# Patient Record
Sex: Female | Born: 1970 | Race: White | Hispanic: No | Marital: Married | Smoking: Never smoker
Health system: Southern US, Community
[De-identification: ages and names within clinical notes are randomized; demographics above are authoritative.]

## PROBLEM LIST (undated history)

## (undated) DIAGNOSIS — D649 Anemia, unspecified: Secondary | ICD-10-CM

## (undated) DIAGNOSIS — R51 Headache: Secondary | ICD-10-CM

## (undated) DIAGNOSIS — I639 Cerebral infarction, unspecified: Secondary | ICD-10-CM

## (undated) DIAGNOSIS — D759 Disease of blood and blood-forming organs, unspecified: Secondary | ICD-10-CM

## (undated) DIAGNOSIS — C801 Malignant (primary) neoplasm, unspecified: Secondary | ICD-10-CM

## (undated) DIAGNOSIS — R519 Headache, unspecified: Secondary | ICD-10-CM

## (undated) HISTORY — PX: PARTIAL HYSTERECTOMY: SHX80

## (undated) HISTORY — PX: BLADDER SUSPENSION: SHX72

## (undated) HISTORY — PX: ABDOMINAL HYSTERECTOMY: SHX81

---

## 1991-05-30 DIAGNOSIS — I639 Cerebral infarction, unspecified: Secondary | ICD-10-CM

## 1991-05-30 HISTORY — DX: Cerebral infarction, unspecified: I63.9

## 1998-03-27 ENCOUNTER — Encounter: Payer: Self-pay | Admitting: Emergency Medicine

## 1998-03-27 ENCOUNTER — Emergency Department (HOSPITAL_COMMUNITY): Admission: EM | Admit: 1998-03-27 | Discharge: 1998-03-27 | Payer: Self-pay | Admitting: Emergency Medicine

## 1999-10-21 ENCOUNTER — Other Ambulatory Visit: Admission: RE | Admit: 1999-10-21 | Discharge: 1999-10-21 | Payer: Self-pay | Admitting: Gynecology

## 2000-05-02 ENCOUNTER — Ambulatory Visit (HOSPITAL_COMMUNITY): Admission: RE | Admit: 2000-05-02 | Discharge: 2000-05-02 | Payer: Self-pay | Admitting: *Deleted

## 2000-05-02 ENCOUNTER — Encounter: Payer: Self-pay | Admitting: *Deleted

## 2000-07-23 ENCOUNTER — Inpatient Hospital Stay (HOSPITAL_COMMUNITY): Admission: AD | Admit: 2000-07-23 | Discharge: 2000-07-25 | Payer: Self-pay | Admitting: *Deleted

## 2001-06-20 ENCOUNTER — Other Ambulatory Visit: Admission: RE | Admit: 2001-06-20 | Discharge: 2001-06-20 | Payer: Self-pay | Admitting: Gynecology

## 2003-10-09 ENCOUNTER — Other Ambulatory Visit: Admission: RE | Admit: 2003-10-09 | Discharge: 2003-10-09 | Payer: Self-pay | Admitting: Family Medicine

## 2004-02-12 ENCOUNTER — Ambulatory Visit (HOSPITAL_COMMUNITY): Admission: RE | Admit: 2004-02-12 | Discharge: 2004-02-12 | Payer: Self-pay | Admitting: Family Medicine

## 2004-04-19 ENCOUNTER — Other Ambulatory Visit: Admission: RE | Admit: 2004-04-19 | Discharge: 2004-04-19 | Payer: Self-pay | Admitting: Gynecology

## 2005-12-20 ENCOUNTER — Ambulatory Visit (HOSPITAL_COMMUNITY): Admission: RE | Admit: 2005-12-20 | Discharge: 2005-12-21 | Payer: Self-pay | Admitting: Obstetrics and Gynecology

## 2005-12-20 ENCOUNTER — Encounter (INDEPENDENT_AMBULATORY_CARE_PROVIDER_SITE_OTHER): Payer: Self-pay | Admitting: *Deleted

## 2008-09-29 ENCOUNTER — Ambulatory Visit (HOSPITAL_COMMUNITY): Admission: RE | Admit: 2008-09-29 | Discharge: 2008-09-29 | Payer: Self-pay | Admitting: Obstetrics and Gynecology

## 2008-11-24 ENCOUNTER — Encounter: Admission: RE | Admit: 2008-11-24 | Discharge: 2008-11-24 | Payer: Self-pay | Admitting: Obstetrics and Gynecology

## 2010-06-20 ENCOUNTER — Encounter: Payer: Self-pay | Admitting: Obstetrics and Gynecology

## 2010-09-06 LAB — CBC
HCT: 44.8 % (ref 36.0–46.0)
Hemoglobin: 15.5 g/dL — ABNORMAL HIGH (ref 12.0–15.0)
MCHC: 34.6 g/dL (ref 30.0–36.0)
MCV: 92.3 fL (ref 78.0–100.0)
RBC: 4.85 MIL/uL (ref 3.87–5.11)
RDW: 12.3 % (ref 11.5–15.5)

## 2010-10-11 NOTE — Op Note (Signed)
[name], [name]               ACCOUNT NO.:  [id number]   MEDICAL RECORD NO.:  [id number]          PATIENT TYPE:  AMB   LOCATION:  SDC                           FACILITY:  [facility]   PHYSICIAN:  Duke Salvia. Marcelle Overlie, M.D.DATE OF BIRTH:  11-03-1970   DATE OF PROCEDURE:  09/29/2008  DATE OF DISCHARGE:                               OPERATIVE REPORT   PREOPERATIVE DIAGNOSIS:  Stress urinary incontinence.   POSTOPERATIVE DIAGNOSIS:  Stress urinary incontinence.   PROCEDURE:  Mid urethral sling, Solyx single incision sling.   SURGEON:  Duke Salvia. Marcelle Overlie, MD   ANESTHESIA:  General.   COMPLICATIONS:  None.   DRAINS:  Foley catheter.   SPECIMENS:  None.   BLOOD LOSS:  Less than 50 mL.   PROCEDURE AND FINDINGS:  The patient was taken to the operating room.  After an adequate level of general anesthetic was obtained with the  patient's legs in stirrups, the perineum and vagina were prepped and  draped.  The bladder was drained.  EUA carried out, which was  unremarkable.  Weighted speculum was positioned.  Xylocaine 1% with  dilute epinephrine was then used to inject the mid urethral area and  into the area of dissection behind the inferior pubic ramus.  A 2-2.5 cm  mid urethral vertical incision was then made, Metzenbaum scissors were  then used to perform minimal sharp dissection, and the surgeon's finger  was used perform blunt dissection to palpate the posterior portion of  the inferior pubic ramus on each side.  The Solyx SIS was then anchored  starting on the patient's right side, placing the inserter behind the  inferior pubic ramus at a 45-degree angle and anchoring it at the  midpoint per protocol.  The sling was then reloaded and positioned  behind the inferior pubic ramus on the left, anchoring in obturator  internus with appropriate tensioning, then released in the anchor.  The  incision was closed with interrupted 2-0 Vicryl sutures.  Cystoscopy was  carried out revealing  no evidence of any injury to bladder or urethra.  Foley catheter was positioned at that point.  A vaginal pack was placed  for pressure hemostasis.  She tolerated this well and went to recovery  room in good condition.      Richard M. Marcelle Overlie, M.D.  Electronically Signed    RMH/MEDQ  D:  09/29/2008  T:  09/29/2008  Job:  [id number]

## 2010-10-11 NOTE — H&P (Signed)
NAMESHARHONDA, ATWOOD               ACCOUNT NO.:  [id number]   MEDICAL RECORD NO.:  [id number]          PATIENT TYPE:  AMB   LOCATION:                                [facility]  [facility]   PHYSICIAN:  Duke Salvia. Marcelle Overlie, M.D.DATE OF BIRTH:  August 20, 1970   DATE OF ADMISSION:  09/29/2008  DATE OF DISCHARGE:                              HISTORY & PHYSICAL   Date of surgery is Sep 29, 2008.   CHIEF COMPLAINT:  Stress urinary incontinence   HISTORY OF PRESENT ILLNESS:  A 40 year old G2, P1, prior hysterectomy  with a 44 year old child, who has had SUI that has worsened over time,  recently had urodynamics performed that did show leakage with a full  bladder.  PVR at 9 mL with a normal bladder capacity, LPP 74, 98 and 105  with an MUCP that was normal 86 and 88.  She presents now for Solyx SIS  mid urethral sling.  This procedure including risks related to bleeding,  infection, mesh erosion, urinary retention expected continent rates all  discussed with her, which she understands and accepts.   PAST MEDICAL HISTORY.:   ALLERGIES:  PENICILLIN, ASPIRIN, LODINE, PERCOCET, and DEMEROL.   PCP is Dr. Laurann Montana.   PAST SURGICAL HISTORY:  She has had one delivery in 1997 and  hysterectomy in 2007.   FAMILY HISTORY:  Significant for migraine headache, heart disease,  asthma, hepatitis, epilepsy, thyroid disease, tuberculosis, gallbladder  disease, kidney disease, UTI, osteoporosis, arthritis, diabetes, and  breast, uterine and colon cancer.   SOCIAL HISTORY:  Denies drug or cigarette or alcohol use.  She is  married.   PHYSICAL EXAMINATION:  VITAL SIGNS:  Temperature 98.2 and blood pressure  120/78.  HEENT: Unremarkable.  NECK:  Supple without masses.  LUNGS:  Clear.  CARDIOVASCULAR:  Regular rate and rhythm without murmurs, rubs, or  gallops.  BREASTS:  Without masses.  ABDOMEN:  Soft, flat, and nontender.  PELVIC:  Normal external genitalia.  The vaginal cuff clear.  Bimanual  negative.  EXTREMITIES:  Unremarkable.  NEUROLOGIC:  Unremarkable.   IMPRESSION:  Stress urinary incontinence.   PLAN:  Solyx mid ureteral sling.  Procedure and risks reviewed as above.      Richard M. Marcelle Overlie, M.D.  Electronically Signed     RMH/MEDQ  D:  09/29/2008  T:  09/30/2008  Job:  [id number]
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
## 2010-10-14 NOTE — H&P (Signed)
[name], [name]               ACCOUNT NO.:  [id number]   MEDICAL RECORD NO.:  [id number]          PATIENT TYPE:  AMB   LOCATION:  SDC                           [facility]ACILITY:  [facility]   PHYSICIAN:  Duke Salvia. Marcelle Overlie, M.D.DATE OF BIRTH:  [birth year]   DATE OF ADMISSION:  12/20/2005  DATE OF DISCHARGE:                                HISTORY & PHYSICAL   CHIEF COMPLAINT:  Dysmenorrhea, menorrhagia.   HISTORY OF PRESENT ILLNESS:  A 40 year old, G2, P1 who had one child  delivered vaginally in 1997, her husband has had a vasectomy. She has been  seeing another GYN physician complaining of heavy menstrual flow with severe  cramping and collisional-type dyspareunia with no improvements with  conservative measures such as ovulation suppression. We discussed a number  of options including laparoscopy. She has a preference for definitive  hysterectomy and presents now for LAVH.  This procedure including the risk  of bleeding, infection, transfusion, adjacent organ injury along with her  expected recovery time are all reviewed. I did discuss the possibility of  USO if significant ovarian polyp pathology was noted.   PAST MEDICAL HISTORY:   ALLERGIES:  PENICILLIN, PERCOCET, CODEINE, DEMEROL, ASPIRIN.   PAST SURGICAL HISTORY:  Tonsillectomy, fractured arm.   REVIEW OF SYSTEMS:  She has had a colposcopy in the past but she has never  had dysplasia that was treated. Last Pap was dated June 2007 which was  normal.   FAMILY HISTORY:  Significant for mother with IBS, history of hypertension in  both parents along with diabetes.   OBSTETRICAL HISTORY:  One vaginal delivery in 1997, one SAB in 1995.   PHYSICAL EXAMINATION:  VITAL SIGNS:  Temperature 98.2, blood pressure  128/72.  HEENT:  Unremarkable.  NECK:  Supple without masses.  LUNGS:  Clear.  CARDIOVASCULAR:  Regular rate and rhythm without murmurs, rubs or gallops  noted.  BREASTS:  Without masses.  ABDOMEN:  Soft, flat, nontender.  PELVIC:  Normal external genitalia. vagina and cervix clear.  Uterus mid  position, normal size, adnexa negative. No unusual nodularity or messes.  EXTREMITIES/NEUROLOGIC:  Unremarkable.   IMPRESSION:  Menorrhagia with collisional dyspareunia and dysmenorrhea.   PLAN:  Laparoscopically assisted vaginal hysterectomy. Procedure and risks  reviewed as above.      Richard M. Marcelle Overlie, M.D.  Electronically Signed     RMH/MEDQ  D:  12/18/2005  T:  12/18/2005  Job:  [id number]
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
## 2010-10-14 NOTE — Op Note (Signed)
[name], [name]               ACCOUNT NO.:  [id number]   MEDICAL RECORD NO.:  [id number]          PATIENT TYPE:  AMB   LOCATION:  SDC                           FACILITY:  [facility]   PHYSICIAN:  Duke Salvia. Marcelle Overlie, M.D.DATE OF BIRTH:  [birth year]   DATE OF PROCEDURE:  12/20/2005  DATE OF DISCHARGE:                                 OPERATIVE REPORT   PREOPERATIVE DIAGNOSES:  1.  Menorrhagia.  2.  Pelvic pain.  3.  Dyspareunia.   POSTOPERATIVE DIAGNOSES:  1.  Menorrhagia.  2.  Pelvic pain.  3.  Dyspareunia.  4.  Left peritubal adhesion.   PROCEDURE:  Diagnostic laparoscopy with lysis of adhesions, laparoscopically-  assisted vaginal hysterectomy.   SURGEON:  Duke Salvia. Marcelle Overlie, M.D.   ASSISTANT:  Juluis Mire, M.D.   ANESTHESIA:  General endotracheal.   COMPLICATIONS:  None.   DRAINS:  Foley catheter.   BLOOD LOSS:  150.   SPECIMENS REMOVED:  Uterus.   PROCEDURE AND FINDINGS:  The patient was taken to the operating room.  After  an adequate level of general endotracheal anesthesia was obtained with the  patient's legs in stirrups, the abdomen, perineum and vagina were prepped  and draped in the usual manner for laparoscopy.  The bladder was drained  with a Foley catheter.  A Hulka tenaculum was positioned.  Attention  directed to the abdomen, where a 2 cm subumbilical incision was made after  infiltrating with 0.5% Marcaine plain.  The Veress needle was introduced  without difficulty, its intra-abdominal position was verified by pressure  and water testing.  After a 2.5 L pneumoperitoneum was then created, the  laparoscopic trocar and sleeve were then introduced without difficulty.  There was no evidence any bleeding or trauma.  Three fingerbreadths above  the symphysis in the midline a 5 mm trocar was inserted under direct  visualization.  The patient then placed in Trendelenburg and the pelvic  findings as follows.   The uterus itself was normal size, mobile.  The  anterior and posterior cul-  de-sac spaces were free and clear.  The right tube and ovary, upper abdomen  and appendix were normal.  The left ovary had a small functional cyst,  postovulatory.  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  With blunt dissection this was  freed up and was hemostatic.  It allowed the tube to be elevated.  She had  complained some of more pain on the left side.  The remainder of the tube  and ovary were completely normal.  A decision made to conserve.  No other  adhesions noted.  The gyrus PK instrument was then used to coagulate and  divide the utero-ovarian pedicle on each side down to and including the  round ligament with excellent hemostasis.  The vaginal portion procedure  started at this point.   Legs were extended, the weighted speculum was positioned and the cervix  grasped with a tenaculum.  The cervicovaginal mucosa was incised with the  Bovie.  Posterior colpotomy performed without difficulty.  The bladder was  advanced superiorly with sharp and blunt dissection until the peritoneal  reflection could be identified.  This was entered sharply and a retractor  used to gently elevate the bladder out of the field.  Using the handheld  Gyrus PK, the lower broad ligament pedicles, uterine vasculature pedicle and  upper broad ligament pedicles were clamped, coagulated and divided, staying  close to the uterus.  The fundus of the uterus was then delivered  posteriorly.  Remaining pedicles were coagulated and divided.  The specimen  was removed.  Cuff closed with a running locked 2-0 Vicryl suture.  The  McCall culdoplasty suture was then positioned, picking up left uterosacral  ligament, posterior peritoneum across to the right uterosacral ligament,  then tied down for extra posterior support.  Prior to closure the  sponge,  needle and instrument counts were reported as correct x2.  Cuff closed from  right to left with interrupted 2-0 Monocryl sutures with excellent  hemostasis.  Foley catheter positioned draining clear urine.  Repeat  laparoscopy carried out at that point, copious irrigation carried out with  aspiration, pressure was reduced, the operative site inspected carefully and  noted to be hemostatic.  Instruments were removed, gas allowed to escape.  The deep fascia was closed with 4-0Dexon, subcuticular sutures and  Dermabond.  She tolerated this well, went to the recovery room in good  condition.      Richard M. Marcelle Overlie, M.D.  Electronically Signed     RMH/MEDQ  D:  12/20/2005  T:  12/20/2005  Job:  [id number]

## 2014-12-10 ENCOUNTER — Other Ambulatory Visit: Payer: Self-pay | Admitting: Obstetrics and Gynecology

## 2014-12-14 LAB — CYTOLOGY - PAP

## 2015-01-20 NOTE — H&P (Signed)
[facility]  DICTATION #  CSN# [id number]   Margarette Asal, MD 01/20/2015 10:24 AM

## 2015-01-21 NOTE — H&P (Signed)
NAME:  [name], [name]                    ACCOUNT NO.:  MEDICAL RECORD NO.:  [id number]  LOCATION:                                 FACILITY:  PHYSICIAN:  Ralene Bathe. Matthew Saras, M.D.DATE OF BIRTH:  1971/05/06  DATE OF ADMISSION: DATE OF DISCHARGE:                             HISTORY & PHYSICAL   CHIEF COMPLAINT:  Pelvic pain, dyspareunia, symptomatic cystocele and rectocele.  HISTORY OF PRESENT ILLNESS:  A 44 year old, G2, P1.  The patient had an LAVH in 2007.  The ovaries were normal at that time, and had a mid urethral sling in 2010 and has been continent since that time, but recently has experienced worsening problem with pelvic pain in particular, deep dyspareunia along with symptomatic cystocele and rectocele.  Temple 10/15 was 15.9.  Ultrasound 10/15 did not show any adnexal masses.  On examination, she did have a mild-to-moderate cystocele and rectocele with cuff support and the UV angle in the mid urethral area from her prior sling all looked normal.  At the time of the consult in November, 2015, it was told that other have been used to evaluate her problem would be laparoscopy or further imaging.  Because of the continued problems with dyspareunia and pain, she presents at this time for laparoscopy with possible USO or BSO along with A and P repair.  The possibility requiring laparotomy with BSO are discussed with her.  Other specific risks related to bleeding, infection, adjacent organ injury, wound infection, phlebitis along with her expected recovery time, with the need for ERT discuss with her, which she understands and accepts.  ALLERGIES:  Aspirin, penicillin, Percocet, codeine, and Demerol  OBSTETRICAL HISTORY:  She has had 1 vaginal delivery in 1997, LAVH in 2007, and mid urethral sling in 2010.  FAMILY HISTORY:  Significant for headache, heart disease, hepatitis, IBS, epilepsy, thyroid disease, tuberculosis, gallbladder disease, kidney disease, UTI, osteoporosis,  arthritis, diabetes, and uterine cancer.  SOCIAL HISTORY:  Denies alcohol, tobacco or drug use.  She is married. Dr. Burt Ek is her medical doctor.  PHYSICAL EXAMINATION:  VITAL SIGNS:  Temp 98.2 and blood pressure 120/78.  HEENT:  Unremarkable.  NECK:  Supple without masses.  LUNGS: Clear.  CARDIOVASCULAR:  Regular rate and rhythm without murmurs, rubs or gallops.  BREASTS:  Without masses.  ABDOMEN:  Soft, flat, and nontender.  GENITOURINARY:  Vulva unremarkable.  There is a mild cystocele and rectocele with straining.  Cup support looks.  Bimanual reveals no definite nodularity or masses.  She does have some discomfort.  IMPRESSION:  Dyspareunia, pelvic pain, possible adnexal adhesions discussed with her, cystocele and rectocele.  PLAN:  Laparoscopy with possible USO or BSO, A and P repair.  Procedure and risks discussed as above.     Amandy Chubbuck M. Matthew Saras, M.D.     [facility]  D:  01/20/2015  T:  01/20/2015  Job:  [id number]

## 2015-01-25 ENCOUNTER — Encounter (HOSPITAL_COMMUNITY): Payer: Self-pay

## 2015-01-25 ENCOUNTER — Encounter (HOSPITAL_COMMUNITY)
Admission: RE | Admit: 2015-01-25 | Discharge: 2015-01-25 | Disposition: A | Discharge status: Home / Self Care | Payer: BLUE CROSS/BLUE SHIELD | Source: Ambulatory Visit | Attending: Obstetrics and Gynecology | Admitting: Obstetrics and Gynecology

## 2015-01-25 ENCOUNTER — Encounter (INDEPENDENT_AMBULATORY_CARE_PROVIDER_SITE_OTHER): Payer: Self-pay

## 2015-01-25 DIAGNOSIS — N736 Female pelvic peritoneal adhesions (postinfective): Secondary | ICD-10-CM | POA: Diagnosis not present

## 2015-01-25 DIAGNOSIS — N8110 Cystocele, unspecified: Secondary | ICD-10-CM | POA: Diagnosis not present

## 2015-01-25 DIAGNOSIS — N941 Dyspareunia: Secondary | ICD-10-CM | POA: Diagnosis not present

## 2015-01-25 DIAGNOSIS — Z880 Allergy status to penicillin: Secondary | ICD-10-CM | POA: Diagnosis not present

## 2015-01-25 DIAGNOSIS — Z8673 Personal history of transient ischemic attack (TIA), and cerebral infarction without residual deficits: Secondary | ICD-10-CM | POA: Diagnosis not present

## 2015-01-25 DIAGNOSIS — Z886 Allergy status to analgesic agent status: Secondary | ICD-10-CM | POA: Diagnosis not present

## 2015-01-25 DIAGNOSIS — Z6831 Body mass index (BMI) 31.0-31.9, adult: Secondary | ICD-10-CM | POA: Diagnosis not present

## 2015-01-25 DIAGNOSIS — N816 Rectocele: Secondary | ICD-10-CM | POA: Diagnosis present

## 2015-01-25 DIAGNOSIS — E669 Obesity, unspecified: Secondary | ICD-10-CM | POA: Diagnosis not present

## 2015-01-25 DIAGNOSIS — Z885 Allergy status to narcotic agent status: Secondary | ICD-10-CM | POA: Diagnosis not present

## 2015-01-25 DIAGNOSIS — D649 Anemia, unspecified: Secondary | ICD-10-CM | POA: Diagnosis not present

## 2015-01-25 HISTORY — DX: Anemia, unspecified: D64.9

## 2015-01-25 HISTORY — DX: Malignant (primary) neoplasm, unspecified: C80.1

## 2015-01-25 HISTORY — DX: Disease of blood and blood-forming organs, unspecified: D75.9

## 2015-01-25 HISTORY — DX: Headache: R51

## 2015-01-25 HISTORY — DX: Headache, unspecified: R51.9

## 2015-01-25 HISTORY — DX: Cerebral infarction, unspecified: I63.9

## 2015-01-25 LAB — CBC
HEMATOCRIT: 44.1 % (ref 36.0–46.0)
HEMOGLOBIN: 15.4 g/dL — AB (ref 12.0–15.0)
MCH: 31 pg (ref 26.0–34.0)
MCHC: 34.9 g/dL (ref 30.0–36.0)
MCV: 88.9 fL (ref 78.0–100.0)
Platelets: 278 10*3/uL (ref 150–400)
RBC: 4.96 MIL/uL (ref 3.87–5.11)
RDW: 12.6 % (ref 11.5–15.5)
WBC: 10.7 10*3/uL — ABNORMAL HIGH (ref 4.0–10.5)

## 2015-01-25 NOTE — Patient Instructions (Signed)
Your procedure is scheduled on:01/28/15  Enter through the Main Entrance at :[place] up desk phone and dial [phone] and inform us of your arrival.  Please call [phone] if you have any problems the morning of surgery.  Remember: Do not eat food or drink liquids, including water, after midnight:WED.    You may brush your teeth the morning of surgery.  Take these meds the morning of surgery with a sip of water:none  DO NOT wear jewelry, eye make-up, lipstick,body lotion, or dark fingernail polish.  (Polished toes are ok) You may wear deodorant.  If you are to be admitted after surgery, leave suitcase in car until your room has been assigned. Patients discharged on the day of surgery will not be allowed to drive home. Wear loose fitting, comfortable clothes for your ride home.

## 2015-01-27 MED ORDER — GENTAMICIN SULFATE 40 MG/ML IJ SOLN
INTRAMUSCULAR | Status: AC
  Administered 2015-01-28: 114.5 mL via INTRAVENOUS
  Filled 2015-01-27: qty 8.5

## 2015-01-28 ENCOUNTER — Encounter (HOSPITAL_COMMUNITY): Payer: Self-pay | Admitting: *Deleted

## 2015-01-28 ENCOUNTER — Ambulatory Visit (HOSPITAL_COMMUNITY): Payer: BLUE CROSS/BLUE SHIELD | Admitting: Anesthesiology

## 2015-01-28 ENCOUNTER — Observation Stay (HOSPITAL_COMMUNITY)
Admission: RE | Admit: 2015-01-28 | Discharge: 2015-01-29 | Disposition: A | Discharge status: Home / Self Care | Payer: BLUE CROSS/BLUE SHIELD | Source: Ambulatory Visit | Attending: Obstetrics and Gynecology | Admitting: Obstetrics and Gynecology

## 2015-01-28 ENCOUNTER — Encounter (HOSPITAL_COMMUNITY)
Admission: RE | Disposition: A | Discharge status: Home / Self Care | Payer: Self-pay | Source: Ambulatory Visit | Attending: Obstetrics and Gynecology

## 2015-01-28 DIAGNOSIS — N941 Dyspareunia: Secondary | ICD-10-CM | POA: Diagnosis not present

## 2015-01-28 DIAGNOSIS — Z880 Allergy status to penicillin: Secondary | ICD-10-CM | POA: Insufficient documentation

## 2015-01-28 DIAGNOSIS — R102 Pelvic and perineal pain: Secondary | ICD-10-CM | POA: Diagnosis present

## 2015-01-28 DIAGNOSIS — N8110 Cystocele, unspecified: Secondary | ICD-10-CM | POA: Insufficient documentation

## 2015-01-28 DIAGNOSIS — Z885 Allergy status to narcotic agent status: Secondary | ICD-10-CM | POA: Insufficient documentation

## 2015-01-28 DIAGNOSIS — N816 Rectocele: Secondary | ICD-10-CM | POA: Insufficient documentation

## 2015-01-28 DIAGNOSIS — Z6831 Body mass index (BMI) 31.0-31.9, adult: Secondary | ICD-10-CM | POA: Insufficient documentation

## 2015-01-28 DIAGNOSIS — D649 Anemia, unspecified: Secondary | ICD-10-CM | POA: Insufficient documentation

## 2015-01-28 DIAGNOSIS — E669 Obesity, unspecified: Secondary | ICD-10-CM | POA: Insufficient documentation

## 2015-01-28 DIAGNOSIS — Z886 Allergy status to analgesic agent status: Secondary | ICD-10-CM | POA: Insufficient documentation

## 2015-01-28 DIAGNOSIS — N736 Female pelvic peritoneal adhesions (postinfective): Secondary | ICD-10-CM | POA: Insufficient documentation

## 2015-01-28 DIAGNOSIS — Z8673 Personal history of transient ischemic attack (TIA), and cerebral infarction without residual deficits: Secondary | ICD-10-CM | POA: Insufficient documentation

## 2015-01-28 HISTORY — PX: ANTERIOR AND POSTERIOR REPAIR: SHX5121

## 2015-01-28 HISTORY — PX: LAPAROSCOPIC SALPINGO OOPHERECTOMY: SHX5927

## 2015-01-28 LAB — TYPE AND SCREEN
ABO/RH(D): O POS
Antibody Screen: NEGATIVE

## 2015-01-28 SURGERY — ANTERIOR (CYSTOCELE) AND POSTERIOR REPAIR (RECTOCELE)
Anesthesia: General

## 2015-01-28 MED ORDER — METOCLOPRAMIDE HCL 5 MG/ML IJ SOLN
INTRAMUSCULAR | Status: AC
  Filled 2015-01-28: qty 2

## 2015-01-28 MED ORDER — LIDOCAINE HCL (CARDIAC) 20 MG/ML IV SOLN
INTRAVENOUS | Status: DC | PRN
  Administered 2015-01-28: 40 mg via INTRAVENOUS
  Administered 2015-01-28: 60 mg via INTRAVENOUS

## 2015-01-28 MED ORDER — ONDANSETRON HCL 4 MG/2ML IJ SOLN
4.0000 mg | Freq: Four times a day (QID) | INTRAMUSCULAR | Status: DC | PRN
  Filled 2015-01-28: qty 2

## 2015-01-28 MED ORDER — ROCURONIUM BROMIDE 100 MG/10ML IV SOLN
INTRAVENOUS | Status: DC | PRN
  Administered 2015-01-28: 25 mg via INTRAVENOUS
  Administered 2015-01-28: 5 mg via INTRAVENOUS

## 2015-01-28 MED ORDER — LIDOCAINE HCL (CARDIAC) 20 MG/ML IV SOLN
INTRAVENOUS | Status: AC
  Filled 2015-01-28: qty 5

## 2015-01-28 MED ORDER — ACETAMINOPHEN 10 MG/ML IV SOLN
1000.0000 mg | Freq: Once | INTRAVENOUS | Status: AC
  Administered 2015-01-28: 1000 mg via INTRAVENOUS
  Filled 2015-01-28: qty 100

## 2015-01-28 MED ORDER — METOCLOPRAMIDE HCL 5 MG/ML IJ SOLN
10.0000 mg | Freq: Once | INTRAMUSCULAR | Status: AC | PRN
  Administered 2015-01-28: 10 mg via INTRAVENOUS

## 2015-01-28 MED ORDER — BUPIVACAINE HCL (PF) 0.25 % IJ SOLN
INTRAMUSCULAR | Status: AC
  Filled 2015-01-28: qty 30

## 2015-01-28 MED ORDER — NEOSTIGMINE METHYLSULFATE 10 MG/10ML IV SOLN
INTRAVENOUS | Status: AC
  Filled 2015-01-28: qty 1

## 2015-01-28 MED ORDER — ACETAMINOPHEN 10 MG/ML IV SOLN
1000.0000 mg | Freq: Once | INTRAVENOUS | Status: DC
  Filled 2015-01-28: qty 100

## 2015-01-28 MED ORDER — MENTHOL 3 MG MT LOZG: 1.0000 | LOZENGE | OROMUCOSAL | Status: DC | PRN

## 2015-01-28 MED ORDER — GLYCOPYRROLATE 0.2 MG/ML IJ SOLN
INTRAMUSCULAR | Status: DC | PRN
  Administered 2015-01-28: 0.1 mg via INTRAVENOUS
  Administered 2015-01-28: .3 mg via INTRAVENOUS
  Administered 2015-01-28: 0.1 mg via INTRAVENOUS

## 2015-01-28 MED ORDER — DEXTROSE IN LACTATED RINGERS 5 % IV SOLN
INTRAVENOUS | Status: DC
  Administered 2015-01-28 (×2): via INTRAVENOUS

## 2015-01-28 MED ORDER — ESTRADIOL 0.1 MG/GM VA CREA
TOPICAL_CREAM | VAGINAL | Status: DC | PRN
  Administered 2015-01-28: 1 via VAGINAL

## 2015-01-28 MED ORDER — LIDOCAINE-EPINEPHRINE 1 %-1:100000 IJ SOLN
INTRAMUSCULAR | Status: DC | PRN
  Administered 2015-01-28 (×2): 5 mL

## 2015-01-28 MED ORDER — ESTRADIOL 0.1 MG/GM VA CREA
TOPICAL_CREAM | VAGINAL | Status: AC
  Filled 2015-01-28: qty 42.5

## 2015-01-28 MED ORDER — SODIUM CHLORIDE 0.9 % IJ SOLN: 9.0000 mL | INTRAMUSCULAR | Status: DC | PRN

## 2015-01-28 MED ORDER — DEXAMETHASONE SODIUM PHOSPHATE 10 MG/ML IJ SOLN
INTRAMUSCULAR | Status: AC
  Filled 2015-01-28: qty 1

## 2015-01-28 MED ORDER — MIDAZOLAM HCL 2 MG/2ML IJ SOLN
INTRAMUSCULAR | Status: AC
  Filled 2015-01-28: qty 4

## 2015-01-28 MED ORDER — MORPHINE SULFATE 1 MG/ML IV SOLN: INTRAVENOUS | Status: DC

## 2015-01-28 MED ORDER — FENTANYL CITRATE (PF) 250 MCG/5ML IJ SOLN
INTRAMUSCULAR | Status: AC
Start: 2015-01-28 — End: 2015-01-28
  Filled 2015-01-28: qty 25

## 2015-01-28 MED ORDER — GLYCOPYRROLATE 0.2 MG/ML IJ SOLN
INTRAMUSCULAR | Status: AC
  Filled 2015-01-28: qty 3

## 2015-01-28 MED ORDER — LIDOCAINE-EPINEPHRINE 1 %-1:100000 IJ SOLN
INTRAMUSCULAR | Status: AC
  Filled 2015-01-28: qty 1

## 2015-01-28 MED ORDER — MIDAZOLAM HCL 2 MG/2ML IJ SOLN
INTRAMUSCULAR | Status: DC | PRN
  Administered 2015-01-28: 2 mg via INTRAVENOUS

## 2015-01-28 MED ORDER — SCOPOLAMINE 1 MG/3DAYS TD PT72
1.0000 | MEDICATED_PATCH | Freq: Once | TRANSDERMAL | Status: DC
  Administered 2015-01-28: 1.5 mg via TRANSDERMAL

## 2015-01-28 MED ORDER — SODIUM CHLORIDE 0.9 % IJ SOLN
INTRAMUSCULAR | Status: DC | PRN
  Administered 2015-01-28: 10 mL

## 2015-01-28 MED ORDER — FENTANYL CITRATE (PF) 250 MCG/5ML IJ SOLN
INTRAMUSCULAR | Status: DC | PRN
  Administered 2015-01-28 (×2): 50 ug via INTRAVENOUS
  Administered 2015-01-28: 100 ug via INTRAVENOUS

## 2015-01-28 MED ORDER — OXYCODONE-ACETAMINOPHEN 5-325 MG PO TABS: 1.0000 | ORAL_TABLET | ORAL | Status: DC | PRN

## 2015-01-28 MED ORDER — ONDANSETRON HCL 4 MG/2ML IJ SOLN
INTRAMUSCULAR | Status: DC | PRN
Start: 2015-01-28 — End: 2015-01-28
  Administered 2015-01-28: 4 mg via INTRAVENOUS

## 2015-01-28 MED ORDER — FENTANYL CITRATE (PF) 100 MCG/2ML IJ SOLN
25.0000 ug | INTRAMUSCULAR | Status: DC | PRN
  Administered 2015-01-28 (×2): 50 ug via INTRAVENOUS

## 2015-01-28 MED ORDER — BUTORPHANOL TARTRATE 1 MG/ML IJ SOLN: 1.0000 mg | INTRAMUSCULAR | Status: DC | PRN

## 2015-01-28 MED ORDER — PROPOFOL 10 MG/ML IV BOLUS
INTRAVENOUS | Status: DC | PRN
  Administered 2015-01-28: 40 mg via INTRAVENOUS
  Administered 2015-01-28: 170 mg via INTRAVENOUS

## 2015-01-28 MED ORDER — SCOPOLAMINE 1 MG/3DAYS TD PT72
MEDICATED_PATCH | TRANSDERMAL | Status: DC
Start: 2015-01-28 — End: 2015-01-29
  Administered 2015-01-28: 1.5 mg via TRANSDERMAL
  Filled 2015-01-28: qty 1

## 2015-01-28 MED ORDER — DIPHENHYDRAMINE HCL 12.5 MG/5ML PO ELIX: 12.5000 mg | ORAL_SOLUTION | Freq: Four times a day (QID) | ORAL | Status: DC | PRN

## 2015-01-28 MED ORDER — CEFAZOLIN SODIUM-DEXTROSE 2-3 GM-% IV SOLR
INTRAVENOUS | Status: AC
  Filled 2015-01-28: qty 50

## 2015-01-28 MED ORDER — DIPHENHYDRAMINE HCL 50 MG/ML IJ SOLN: 12.5000 mg | Freq: Four times a day (QID) | INTRAMUSCULAR | Status: DC | PRN

## 2015-01-28 MED ORDER — 0.9 % SODIUM CHLORIDE (POUR BTL) OPTIME
TOPICAL | Status: DC | PRN
  Administered 2015-01-28: 1000 mL

## 2015-01-28 MED ORDER — PROPOFOL 10 MG/ML IV BOLUS
INTRAVENOUS | Status: AC
  Filled 2015-01-28: qty 20

## 2015-01-28 MED ORDER — NEOSTIGMINE METHYLSULFATE 10 MG/10ML IV SOLN
INTRAVENOUS | Status: DC | PRN
  Administered 2015-01-28: 1.5 mg via INTRAVENOUS

## 2015-01-28 MED ORDER — BUPIVACAINE HCL (PF) 0.25 % IJ SOLN
INTRAMUSCULAR | Status: DC | PRN
  Administered 2015-01-28: 5 mL

## 2015-01-28 MED ORDER — FENTANYL CITRATE (PF) 100 MCG/2ML IJ SOLN
INTRAMUSCULAR | Status: AC
  Filled 2015-01-28: qty 2

## 2015-01-28 MED ORDER — LACTATED RINGERS IV SOLN
INTRAVENOUS | Status: DC
  Administered 2015-01-28 (×3): via INTRAVENOUS

## 2015-01-28 MED ORDER — ONDANSETRON HCL 4 MG/2ML IJ SOLN
4.0000 mg | Freq: Four times a day (QID) | INTRAMUSCULAR | Status: DC | PRN
  Administered 2015-01-28: 4 mg via INTRAVENOUS

## 2015-01-28 MED ORDER — SODIUM CHLORIDE 0.9 % IJ SOLN
INTRAMUSCULAR | Status: AC
  Filled 2015-01-28: qty 10

## 2015-01-28 MED ORDER — DEXAMETHASONE SODIUM PHOSPHATE 4 MG/ML IJ SOLN
INTRAMUSCULAR | Status: DC | PRN
  Administered 2015-01-28: 4 mg via INTRAVENOUS

## 2015-01-28 MED ORDER — ONDANSETRON HCL 4 MG PO TABS: 4.0000 mg | ORAL_TABLET | Freq: Four times a day (QID) | ORAL | Status: DC | PRN

## 2015-01-28 MED ORDER — FENTANYL 10 MCG/ML IV SOLN
INTRAVENOUS | Status: DC
  Administered 2015-01-28: 11:00:00 via INTRAVENOUS
  Administered 2015-01-28: 60 ug via INTRAVENOUS
  Administered 2015-01-28: 16 mL via INTRAVENOUS
  Administered 2015-01-28: 22 mL via INTRAVENOUS
  Administered 2015-01-28: 23:00:00 via INTRAVENOUS
  Administered 2015-01-29: 139.4 ug via INTRAVENOUS
  Filled 2015-01-28 (×2): qty 50

## 2015-01-28 MED ORDER — NALOXONE HCL 0.4 MG/ML IJ SOLN: 0.4000 mg | INTRAMUSCULAR | Status: DC | PRN

## 2015-01-28 MED ORDER — ONDANSETRON HCL 4 MG/2ML IJ SOLN
INTRAMUSCULAR | Status: AC
  Filled 2015-01-28: qty 2

## 2015-01-28 MED ORDER — ROCURONIUM BROMIDE 100 MG/10ML IV SOLN
INTRAVENOUS | Status: AC
  Filled 2015-01-28: qty 1

## 2015-01-28 SURGICAL SUPPLY — 48 items
BAG SPEC RTRVL LRG 6X4 10 (ENDOMECHANICALS) ×2
CABLE HIGH FREQUENCY MONO STRZ (ELECTRODE) IMPLANT
CANISTER SUCT 3000ML (MISCELLANEOUS) ×4 IMPLANT
CATH ROBINSON RED A/P 16FR (CATHETERS) ×4 IMPLANT
CLOTH BEACON ORANGE TIMEOUT ST (SAFETY) ×4 IMPLANT
CONT PATH 16OZ SNAP LID 3702 (MISCELLANEOUS) IMPLANT
DECANTER SPIKE VIAL GLASS SM (MISCELLANEOUS) ×4 IMPLANT
DEVICE CAPIO SLIM SINGLE (INSTRUMENTS) IMPLANT
DRSG COVADERM PLUS 2X2 (GAUZE/BANDAGES/DRESSINGS) ×8 IMPLANT
DRSG OPSITE POSTOP 3X4 (GAUZE/BANDAGES/DRESSINGS) IMPLANT
GAUZE PACKING 2X5 YD STRL (GAUZE/BANDAGES/DRESSINGS) ×2 IMPLANT
GLOVE BIO SURGEON STRL SZ7 (GLOVE) ×4 IMPLANT
GOWN STRL REUS W/TWL LRG LVL3 (GOWN DISPOSABLE) ×16 IMPLANT
LIQUID BAND (GAUZE/BANDAGES/DRESSINGS) ×4 IMPLANT
NDL SPNL 22GX3.5 QUINCKE BK (NEEDLE) IMPLANT
NEEDLE HYPO 22GX1.5 SAFETY (NEEDLE) IMPLANT
NEEDLE INSUFFLATION 120MM (ENDOMECHANICALS) ×4 IMPLANT
NEEDLE SPNL 22GX3.5 QUINCKE BK (NEEDLE) IMPLANT
NS IRRIG 1000ML POUR BTL (IV SOLUTION) ×4 IMPLANT
PACK LAPAROSCOPY BASIN (CUSTOM PROCEDURE TRAY) ×4 IMPLANT
PACK VAGINAL WOMENS (CUSTOM PROCEDURE TRAY) ×4 IMPLANT
POUCH SPECIMEN RETRIEVAL 10MM (ENDOMECHANICALS) ×2 IMPLANT
PROTECTOR NERVE ULNAR (MISCELLANEOUS) ×4 IMPLANT
SEALER TISSUE G2 CVD JAW 35 (ENDOMECHANICALS) IMPLANT
SEALER TISSUE G2 CVD JAW 45CM (ENDOMECHANICALS) ×2 IMPLANT
SET CYSTO W/LG BORE CLAMP LF (SET/KITS/TRAYS/PACK) IMPLANT
SET IRRIG TUBING LAPAROSCOPIC (IRRIGATION / IRRIGATOR) IMPLANT
SUT CAPIO POLYGLYCOLIC (SUTURE) IMPLANT
SUT CHROMIC 2 0 CT 1 (SUTURE) IMPLANT
SUT MON AB 2-0 CT1 36 (SUTURE) ×12 IMPLANT
SUT SILK 2 0 FSL 18 (SUTURE) IMPLANT
SUT VIC AB 2-0 CT1 18 (SUTURE) ×8 IMPLANT
SUT VIC AB 2-0 CT1 27 (SUTURE)
SUT VIC AB 2-0 CT1 TAPERPNT 27 (SUTURE) IMPLANT
SUT VIC AB 2-0 SH 27 (SUTURE)
SUT VIC AB 2-0 SH 27XBRD (SUTURE) IMPLANT
SUT VIC AB 2-0 UR6 27 (SUTURE) ×16 IMPLANT
SUT VICRYL 0 TIES 12 18 (SUTURE) ×4 IMPLANT
SUT VICRYL RAPIDE 3 0 (SUTURE) ×4 IMPLANT
SUT VICRYL RAPIDE 3-0 36IN (SUTURE) ×8 IMPLANT
SUT VICRYL RAPIDE 4/0 PS 2 (SUTURE) ×8 IMPLANT
SYR CONTROL 10ML LL (SYRINGE) ×4 IMPLANT
TOWEL OR 17X24 6PK STRL BLUE (TOWEL DISPOSABLE) ×8 IMPLANT
TRAY FOLEY CATH SILVER 14FR (SET/KITS/TRAYS/PACK) ×4 IMPLANT
TROCAR OPTI TIP 5M 100M (ENDOMECHANICALS) ×8 IMPLANT
TROCAR XCEL DIL TIP R 11M (ENDOMECHANICALS) ×4 IMPLANT
WARMER LAPAROSCOPE (MISCELLANEOUS) ×4 IMPLANT
WATER STERILE IRR 1000ML POUR (IV SOLUTION) ×4 IMPLANT

## 2015-01-28 NOTE — Anesthesia Postprocedure Evaluation (Signed)
  Anesthesia Post-op Note  Patient: [name]  Procedure(s) Performed: Procedure(s): ANTERIOR (CYSTOCELE) AND POSTERIOR REPAIR (RECTOCELE) (N/A) LAPAROSCOPIC LYSIS OF ADHESIONS (Bilateral)  Patient Location: PACU  Anesthesia Type:General  Level of Consciousness: awake, alert  and oriented  Airway and Oxygen Therapy: Patient Spontanous Breathing  Post-op Pain: none  Post-op Assessment: Post-op Vital signs reviewed, Patient's Cardiovascular Status Stable, Respiratory Function Stable, Patent Airway, No signs of Nausea or vomiting and Pain level controlled              Post-op Vital Signs: Reviewed and stable  Last Vitals:  Filed Vitals:   01/28/15 0900  BP: 95/54  Pulse: 73  Temp: 36.8 C  Resp: 19    Complications: No apparent anesthesia complications

## 2015-01-28 NOTE — Addendum Note (Signed)
Addendum  created 01/28/15 0954 by Flossie Dibble, CRNA   Modules edited: Anesthesia Medication Administration

## 2015-01-28 NOTE — Anesthesia Preprocedure Evaluation (Addendum)
Anesthesia Evaluation  Patient identified by MRN, date of birth, ID band Patient awake    Reviewed: Allergy & Precautions, NPO status , Patient's Chart, lab work & pertinent test results  Airway Mallampati: II  TM Distance: >3 FB Neck ROM: Full    Dental no notable dental hx. (+) Teeth Intact   Pulmonary neg pulmonary ROS,  breath sounds clear to auscultation  Pulmonary exam normal       Cardiovascular negative cardio ROS Normal cardiovascular examRhythm:Regular Rate:Normal     Neuro/Psych  Headaches, CVA, No Residual Symptoms negative psych ROS   GI/Hepatic Neg liver ROS, rectocele   Endo/Other  obesity  Renal/GU negative Renal ROS   cystocele    Musculoskeletal negative musculoskeletal ROS (+)   Abdominal (+) + obese,   Peds  Hematology  (+) Blood dyscrasia, anemia ,   Anesthesia Other Findings   Reproductive/Obstetrics dyspareunia                            Anesthesia Physical Anesthesia Plan  ASA: II  Anesthesia Plan: General   Post-op Pain Management:    Induction: Intravenous  Airway Management Planned: Oral ETT  Additional Equipment:   Intra-op Plan:   Post-operative Plan:   Informed Consent: I have reviewed the patients History and Physical, chart, labs and discussed the procedure including the risks, benefits and alternatives for the proposed anesthesia with the patient or authorized representative who has indicated his/her understanding and acceptance.   Dental advisory given  Plan Discussed with: Anesthesiologist, CRNA and Surgeon  Anesthesia Plan Comments:         Anesthesia Quick Evaluation

## 2015-01-28 NOTE — Op Note (Signed)
Preoperative diagnosis: Dyspareunia/pelvic pain, symptomatic cystocele and rectocele  Postoperative diagnosis: Same, pelvic adhesions  Procedure: Diagnostic laparoscopy with lysis of adhesions, anterior posterior colporrhaphy  Surgeon: Matthew Saras  Asst.: Tomlin  EBL: 100 cc  Procedure and findings:  The patient was taken the operating room after an adequate level of general anesthesia was obtained the patient's legs in stirrups the abdomen perineum and vagina were prepped and draped in the usual fashion Foley catheter positioned. Sponge stick for manipulation placed in the vagina appropriate timeouts taken at that point.  Attention directed to the abdomen the subumbilical area was infiltrated with quarter percent Marcaine plain, small incision was made in the varies needle was introduced without difficulty. Its intra-abdominal position was verified by pressure water testing. After 2-1/2 L pneumoperitoneum syncopated, lap scopic trocar and sleeve were then introduced without difficulty. 3 finger breaths above the symphysis in the midline a 5 mm trocar was inserted under direct visualization the patient was then placed in Trendelenburg with the pelvic findings as follows:  Upper abdomen unremarkable the uterus was surgically absent right tube and ovary were normal left tube and ovary were normal after inspection once we released the epiploical  adhesions down to the vaginal cuff area. There was no bowel stuck and in this area. Once the epiploic old adhesions were lysed, the left tube and ovary could be easily visualized and was normal both were conserved. The laparoscopic procedure was terminated at that point instruments removed the umbilical incision closed with 4-0 Monocryl subcuticular and Dermabond on the lower.  Attention directed to the vaginal portion the procedure legs were extended weighted speculum was positioned the anterior vaginal mucosa was grasped in the midline from the UV angle to close  to the top of the cuff diluted Xylocaine was injected midline incision was made and the underlying perivesical fascia was separated with sharp and blunt dissection from the attachment to the mucosa. Once the cystocele was reduced, 20 Vicryls sutures were used to plicate the perivesical fascia in the midline. Excess vaginal mucosa was trimmed and reapproximated with 20 Vicryls interrupted sutures. On the posterior side, a small triangle triangle of skin from the fourchette was excised the posterior mucosa was divided in the midline approximately two thirds the way up the underlying perirectal fascia was separated with sharp and blunt dissection reducing the rectocele, perirectal fascia was then plicated in the midline with 20 Vicryls interrupted sutures small amount of excess mucosa was trimmed and the mucosa closed with a 20 Vicryls interrupted sutures 3-0 Monocryl sutures used to reapproximate the perineal skin. Vaginal pack was placed a Foley catheter was arty in position. She tolerated this well went to recovery room in good condition.  Dictated with [place].

## 2015-01-28 NOTE — Transfer of Care (Signed)
Immediate Anesthesia Transfer of Care Note  Patient: [name]  Procedure(s) Performed: Procedure(s): ANTERIOR (CYSTOCELE) AND POSTERIOR REPAIR (RECTOCELE) (N/A) LAPAROSCOPIC LYSIS OF ADHESIONS (Bilateral)  Patient Location: PACU  Anesthesia Type:General  Level of Consciousness: awake, alert  and oriented  Airway & Oxygen Therapy: Patient Spontanous Breathing and Patient connected to nasal cannula oxygen  Post-op Assessment: Report given to RN and Post -op Vital signs reviewed and stable  Post vital signs: Reviewed and stable  Last Vitals:  Filed Vitals:   01/28/15 0614  BP: 140/95  Pulse: 66  Temp: 36.8 C  Resp: 18    Complications: No apparent anesthesia complications

## 2015-01-28 NOTE — Anesthesia Procedure Notes (Signed)
Procedure Name: Intubation Date/Time: 01/28/2015 7:28 AM Performed by: Flossie Dibble Pre-anesthesia Checklist: Patient being monitored, Suction available, Emergency Drugs available, Patient identified and Timeout performed Patient Re-evaluated:Patient Re-evaluated prior to inductionOxygen Delivery Method: Circle system utilized Preoxygenation: Pre-oxygenation with 100% oxygen Intubation Type: IV induction Ventilation: Mask ventilation without difficulty Laryngoscope Size: Mac and 3 Grade View: Grade I Tube type: Oral Tube size: 7.0 mm Number of attempts: 1 Airway Equipment and Method: Stylet Placement Confirmation: ETT inserted through vocal cords under direct vision,  positive ETCO2 and breath sounds checked- equal and bilateral Secured at: 21 cm Tube secured with: Tape Dental Injury: Teeth and Oropharynx as per pre-operative assessment

## 2015-01-28 NOTE — Progress Notes (Signed)
The patient was re-examined with no change in status 

## 2015-01-29 ENCOUNTER — Encounter (HOSPITAL_COMMUNITY): Payer: Self-pay | Admitting: Obstetrics and Gynecology

## 2015-01-29 DIAGNOSIS — N941 Dyspareunia: Secondary | ICD-10-CM | POA: Diagnosis not present

## 2015-01-29 LAB — CBC
HEMATOCRIT: 38.3 % (ref 36.0–46.0)
Hemoglobin: 13 g/dL (ref 12.0–15.0)
MCH: 30.7 pg (ref 26.0–34.0)
MCHC: 33.9 g/dL (ref 30.0–36.0)
MCV: 90.3 fL (ref 78.0–100.0)
PLATELETS: 264 10*3/uL (ref 150–400)
RBC: 4.24 MIL/uL (ref 3.87–5.11)
RDW: 12.8 % (ref 11.5–15.5)
WBC: 11.2 10*3/uL — AB (ref 4.0–10.5)

## 2015-01-29 MED ORDER — IBUPROFEN 800 MG PO TABS: 800.0000 mg | ORAL_TABLET | Freq: Three times a day (TID) | ORAL | Status: DC | PRN

## 2015-01-29 MED ORDER — TRAMADOL HCL 50 MG PO TABS: 50.0000 mg | ORAL_TABLET | Freq: Four times a day (QID) | ORAL | Status: DC | PRN

## 2015-01-29 NOTE — Discharge Summary (Signed)
Physician Discharge Summary  Patient ID: [name] MRN: [id number] DOB/AGE: 1970/07/18 44 y.o.  Admit date: 01/28/2015 Discharge date: 01/29/2015  Admission Diagnoses:pelvic pain, cystocele/rectocele  Discharge Diagnoses: same Active Problems:   Pelvic pain in female   Discharged Condition: good  Hospital Course: adm for DL>>LOA + A and P repair, D/C on POD 1, cath out, voiding well, tol PO, afeb  Consults: None  Significant Diagnostic Studies: labs:  CBC    Component Value Date/Time   WBC 11.2* 01/29/2015 0540   RBC 4.24 01/29/2015 0540   HGB 13.0 01/29/2015 0540   HCT 38.3 01/29/2015 0540   PLT 264 01/29/2015 0540   MCV 90.3 01/29/2015 0540   MCH 30.7 01/29/2015 0540   MCHC 33.9 01/29/2015 0540   RDW 12.8 01/29/2015 0540      Treatments: surgery: DL>>LOA, A and P repair  Discharge Exam: Blood pressure 120/64, pulse 75, temperature 98.3 F (36.8 C), temperature source Oral, resp. rate 16, height 5\' 5"  (1.651 m), weight 188 lb (85.276 kg), SpO2 98 %. abd soft + BS, incs C/D  Disposition:      Medication List    STOP taking these medications        SYSTANE BALANCE OP      TAKE these medications        CALCIUM + D PO  Take 2 tablets by mouth daily.     ibuprofen 800 MG tablet  Commonly known as:  ADVIL,MOTRIN  Take 1 tablet (800 mg total) by mouth every 8 (eight) hours as needed.     traMADol 50 MG tablet  Commonly known as:  ULTRAM  Take 1 tablet (50 mg total) by mouth every 6 (six) hours as needed.           Follow-up Information    Follow up with Margarette Asal, MD. Schedule an appointment as soon as possible for a visit in 10 days.   Specialty:  Obstetrics and Gynecology   Contact information:   [place] [address] [phone]       Signed: Margarette Asal 01/29/2015, 9:54 AM

## 2015-01-29 NOTE — Anesthesia Postprocedure Evaluation (Signed)
Anesthesia Post Note  Patient: [name]  Procedure(s) Performed: Procedure(s) (LRB): ANTERIOR (CYSTOCELE) AND POSTERIOR REPAIR (RECTOCELE) (N/A) LAPAROSCOPIC LYSIS OF ADHESIONS (Bilateral)  Anesthesia type: GENERAL  Patient location: Mother/Baby  Post pain: Pain level controlled  Post assessment: Post-op Vital signs reviewed  Last Vitals:  Filed Vitals:   01/29/15 0534  BP: 120/64  Pulse: 75  Temp: 36.8 C  Resp: 16    Post vital signs: Reviewed  Level of consciousness: awake  Complications: No apparent anesthesia complications

## 2015-01-29 NOTE — Addendum Note (Signed)
Addendum  created 01/29/15 0920 by Talbot Grumbling, CRNA   Modules edited: Notes Section   Notes Section:  File: [id number]

## 2015-01-29 NOTE — Addendum Note (Signed)
Addendum  created 01/29/15 1233 by Laverle Hobby, CRNA   Modules edited: Charges VN

## 2017-02-08 ENCOUNTER — Other Ambulatory Visit: Payer: Self-pay | Admitting: Obstetrics and Gynecology

## 2017-02-08 DIAGNOSIS — R928 Other abnormal and inconclusive findings on diagnostic imaging of breast: Secondary | ICD-10-CM

## 2017-02-14 ENCOUNTER — Ambulatory Visit
Admission: RE | Admit: 2017-02-14 | Discharge: 2017-02-14 | Disposition: A | Discharge status: Home / Self Care | Payer: BLUE CROSS/BLUE SHIELD | Source: Ambulatory Visit | Attending: Obstetrics and Gynecology | Admitting: Obstetrics and Gynecology

## 2017-02-14 ENCOUNTER — Ambulatory Visit: Payer: BLUE CROSS/BLUE SHIELD

## 2017-02-14 DIAGNOSIS — R928 Other abnormal and inconclusive findings on diagnostic imaging of breast: Secondary | ICD-10-CM

## 2017-12-20 LAB — LIPID PANEL
Cholesterol: 177 (ref 0–200)
HDL: 42 (ref 35–70)
LDL Cholesterol: 109
Triglycerides: 129 (ref 40–160)

## 2018-11-12 ENCOUNTER — Ambulatory Visit: Payer: Self-pay | Admitting: Internal Medicine

## 2018-11-12 ENCOUNTER — Other Ambulatory Visit: Payer: Self-pay

## 2018-11-12 ENCOUNTER — Encounter: Payer: Self-pay | Admitting: Internal Medicine

## 2018-11-12 VITALS — BP 146/97 | HR 67 | Temp 98.4°F | Resp 16 | Ht 65.0 in | Wt 195.0 lb

## 2018-11-12 DIAGNOSIS — R0789 Other chest pain: Secondary | ICD-10-CM

## 2018-11-12 NOTE — Progress Notes (Signed)
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 She called 6/11 and noted was still having issues, hurts more when up and down steps as cleaning and has worsened as that week progressed. Goes up and down many flights of steps. Last week was the worst since she started having to clean and wear the mask (week 4). This past weekend went to a funeral Sat and [facility] Sunday and felt well, no symptoms. Back to work yesterday and late yesterday, after the 3rd round of sanitizing, lungs burning again.Feels it in the center of chest, described today as lungs burning sensatio No SOB, more tightness feeling, and burning but not like heartburn, definitely lung burning sensation. When home, relieves some and when wakes up in am, usually gone. No other associated sx's with no radiation to arms or jaw, no N/V, no marked diaphoresis.  Has tried multiple types of masks without success. The company intially used a germacide that others were getting ill with and changed to an alcohol/water product in recent past. Noted was doing similar cleaning/disinfecting prior, and sx's not start until had to do wearing a mask.  Has a h/o thrombophilia/anti-cardiolipin, also increased lipids and on the last couple visits has had higher BP readings (first 5/27 -140's/92-94, 6/4 -150-164/92) Had asthma at age 64-6, not since No tobacco history  No h/o significant cardiac disease No h/o DM,  FH - + HTN  Current Outpatient Medications on File Prior to Visit  Medication Sig Dispense Refill  . cetirizine (ZYRTEC) 10 MG tablet Take 10 mg by mouth daily.     No current facility-administered medications on file prior to visit.     Allergies  Allergen Reactions  . Aspirin Anaphylaxis  . Codeine Anaphylaxis  . Penicillins Hives and Swelling    Pressure on chest   . Demerol [Meperidine] Hives and Other (See Comments)    Throat scratchy  . Percocet [Oxycodone-Acetaminophen] Hives and Other (See Comments)    agitation  . Stadol [Butorphanol] Nausea And Vomiting     O - NAD, breathing comfortably at rest with mask in place  BP (!) 146/97 (BP Location: Right Arm, Patient Position: Sitting, Cuff Size: Large)   Pulse 67   Temp 98.4 F (36.9 C)   Resp 16   Ht 5\' 5"  (1.651 m)   Wt 195 lb (88.5 kg)   SpO2 99%   BMI 32.45 kg/m    HEENT - sclera anicteric, conj - non-inj'ed, no sinus tenderness, pharynx clear Neck - no adenopathy, carotids 2+ and =, no bruits Car - RRR without m/g/r Pulm - CTA Chest - unable to reproduce the pain with direct palpation on chest wall Abd - soft, NT in epigastric and UQ 's Ext - no LE edema Neuro - affect not flat, approp with conversation, slightly teary eyed at one point discussing next steps. Grossly non-focal  ECG done on 6/4 - NSR, no acute changes and no changes from most recent ECG   Ass -  1. Chest burning sx's, feel unlikely cardiac source although did discuss her risk profile and that possibility, seems to be most affected by cleaning/disinfecting in the mask, discussed possible chemical exposure like a hypersensitivity pneumonitis, possible reactive airway component, doubt infectious pulmonary concern. Patient assured this is not heartburn/GERD. 2. HTN concern   Plan - Reviewed above and feel  further work-up needed and a CXR and PFT's felt best first next steps for tests vs a pulmonary consult to help before pursue some of these tests. They opted for the consult and noting that, do feel ok to write a note for work avoiding the cleaning/disinfecting with a mask at present to hopefully lessen sx's at present (one was given)  Not feel addition of steroids best presently noting above plans  ECG done last visit and ok  Closely monitor in the interim as await pulm consult and referral written today  BP may be  elevated some due to her sx's and did discuss a BP medicine last visit and she was hesitant to add and did not push that today, but did note as symptoms settling down, needs to have recheck of BP's and remain high, does need to start a medicine to manage. Her husband noted they will get a machine to check BP's at home, and I rec'ed doing so every couple days and record. If are consistently high, needs to follow-up.

## 2018-11-18 ENCOUNTER — Encounter: Payer: Self-pay | Admitting: Internal Medicine

## 2018-11-18 NOTE — Progress Notes (Signed)
Faxed referral order and office visit to Dr. Raul Del for appt 11/19/18.

## 2018-12-03 DIAGNOSIS — J4530 Mild persistent asthma, uncomplicated: Secondary | ICD-10-CM | POA: Diagnosis not present

## 2018-12-03 DIAGNOSIS — R05 Cough: Secondary | ICD-10-CM | POA: Diagnosis not present

## 2019-03-05 DIAGNOSIS — J45909 Unspecified asthma, uncomplicated: Secondary | ICD-10-CM | POA: Insufficient documentation

## 2019-03-05 DIAGNOSIS — Z1231 Encounter for screening mammogram for malignant neoplasm of breast: Secondary | ICD-10-CM | POA: Diagnosis not present

## 2019-03-05 DIAGNOSIS — Z01419 Encounter for gynecological examination (general) (routine) without abnormal findings: Secondary | ICD-10-CM | POA: Diagnosis not present

## 2019-03-05 DIAGNOSIS — M1990 Unspecified osteoarthritis, unspecified site: Secondary | ICD-10-CM | POA: Insufficient documentation

## 2019-03-05 DIAGNOSIS — Z6830 Body mass index (BMI) 30.0-30.9, adult: Secondary | ICD-10-CM | POA: Diagnosis not present

## 2019-04-21 DIAGNOSIS — Z20828 Contact with and (suspected) exposure to other viral communicable diseases: Secondary | ICD-10-CM | POA: Diagnosis not present

## 2019-07-31 DIAGNOSIS — H5203 Hypermetropia, bilateral: Secondary | ICD-10-CM | POA: Diagnosis not present

## 2019-08-07 DIAGNOSIS — M545 Low back pain, unspecified: Secondary | ICD-10-CM | POA: Insufficient documentation

## 2019-08-07 DIAGNOSIS — M5417 Radiculopathy, lumbosacral region: Secondary | ICD-10-CM | POA: Diagnosis not present

## 2019-08-15 DIAGNOSIS — M5416 Radiculopathy, lumbar region: Secondary | ICD-10-CM | POA: Diagnosis not present

## 2019-08-19 ENCOUNTER — Other Ambulatory Visit: Payer: Self-pay | Admitting: Specialist

## 2019-08-19 DIAGNOSIS — G8929 Other chronic pain: Secondary | ICD-10-CM

## 2019-08-19 DIAGNOSIS — M5136 Other intervertebral disc degeneration, lumbar region: Secondary | ICD-10-CM | POA: Diagnosis not present

## 2019-08-19 DIAGNOSIS — M545 Low back pain: Secondary | ICD-10-CM | POA: Diagnosis not present

## 2019-08-22 ENCOUNTER — Ambulatory Visit
Admission: RE | Admit: 2019-08-22 | Discharge: 2019-08-22 | Disposition: A | Discharge status: Home / Self Care | Payer: 59 | Source: Ambulatory Visit | Attending: Specialist | Admitting: Specialist

## 2019-08-22 DIAGNOSIS — G8929 Other chronic pain: Secondary | ICD-10-CM

## 2019-08-22 DIAGNOSIS — M5126 Other intervertebral disc displacement, lumbar region: Secondary | ICD-10-CM | POA: Diagnosis not present

## 2019-08-22 MED ORDER — METHYLPREDNISOLONE ACETATE 40 MG/ML INJ SUSP (RADIOLOG
120.0000 mg | Freq: Once | INTRAMUSCULAR | Status: AC
  Administered 2019-08-22: 120 mg via EPIDURAL

## 2019-08-22 MED ORDER — IOPAMIDOL (ISOVUE-M 200) INJECTION 41%
1.0000 mL | Freq: Once | INTRAMUSCULAR | Status: AC
  Administered 2019-08-22: 1 mL via EPIDURAL

## 2019-08-22 NOTE — Discharge Instructions (Signed)

## 2019-09-02 DIAGNOSIS — M545 Low back pain: Secondary | ICD-10-CM | POA: Diagnosis not present

## 2019-09-02 DIAGNOSIS — M5416 Radiculopathy, lumbar region: Secondary | ICD-10-CM | POA: Diagnosis not present

## 2019-09-02 DIAGNOSIS — M5126 Other intervertebral disc displacement, lumbar region: Secondary | ICD-10-CM | POA: Diagnosis not present

## 2019-09-04 DIAGNOSIS — M5416 Radiculopathy, lumbar region: Secondary | ICD-10-CM | POA: Insufficient documentation

## 2019-09-08 DIAGNOSIS — M5416 Radiculopathy, lumbar region: Secondary | ICD-10-CM | POA: Diagnosis not present

## 2019-09-10 DIAGNOSIS — M5416 Radiculopathy, lumbar region: Secondary | ICD-10-CM | POA: Diagnosis not present

## 2019-09-16 DIAGNOSIS — M5416 Radiculopathy, lumbar region: Secondary | ICD-10-CM | POA: Diagnosis not present

## 2019-09-18 DIAGNOSIS — M5416 Radiculopathy, lumbar region: Secondary | ICD-10-CM | POA: Diagnosis not present

## 2019-09-22 DIAGNOSIS — M5416 Radiculopathy, lumbar region: Secondary | ICD-10-CM | POA: Diagnosis not present

## 2019-09-23 DIAGNOSIS — M5126 Other intervertebral disc displacement, lumbar region: Secondary | ICD-10-CM | POA: Diagnosis not present

## 2019-09-23 DIAGNOSIS — M545 Low back pain: Secondary | ICD-10-CM | POA: Diagnosis not present

## 2019-09-23 DIAGNOSIS — M5416 Radiculopathy, lumbar region: Secondary | ICD-10-CM | POA: Diagnosis not present

## 2019-09-23 DIAGNOSIS — M5136 Other intervertebral disc degeneration, lumbar region: Secondary | ICD-10-CM | POA: Diagnosis not present

## 2019-09-25 DIAGNOSIS — M5416 Radiculopathy, lumbar region: Secondary | ICD-10-CM | POA: Diagnosis not present

## 2019-10-06 DIAGNOSIS — M5416 Radiculopathy, lumbar region: Secondary | ICD-10-CM | POA: Diagnosis not present

## 2019-10-13 DIAGNOSIS — M5416 Radiculopathy, lumbar region: Secondary | ICD-10-CM | POA: Diagnosis not present

## 2019-10-14 DIAGNOSIS — M5416 Radiculopathy, lumbar region: Secondary | ICD-10-CM | POA: Diagnosis not present

## 2019-10-14 DIAGNOSIS — M545 Low back pain: Secondary | ICD-10-CM | POA: Diagnosis not present

## 2019-10-14 DIAGNOSIS — M5126 Other intervertebral disc displacement, lumbar region: Secondary | ICD-10-CM | POA: Diagnosis not present

## 2019-10-15 DIAGNOSIS — M5416 Radiculopathy, lumbar region: Secondary | ICD-10-CM | POA: Diagnosis not present

## 2019-10-20 DIAGNOSIS — M5416 Radiculopathy, lumbar region: Secondary | ICD-10-CM | POA: Diagnosis not present

## 2019-10-24 DIAGNOSIS — M5416 Radiculopathy, lumbar region: Secondary | ICD-10-CM | POA: Diagnosis not present

## 2019-10-28 DIAGNOSIS — M5416 Radiculopathy, lumbar region: Secondary | ICD-10-CM | POA: Diagnosis not present

## 2019-10-31 DIAGNOSIS — M5416 Radiculopathy, lumbar region: Secondary | ICD-10-CM | POA: Diagnosis not present

## 2019-11-05 DIAGNOSIS — M5416 Radiculopathy, lumbar region: Secondary | ICD-10-CM | POA: Diagnosis not present

## 2019-11-07 DIAGNOSIS — M545 Low back pain: Secondary | ICD-10-CM | POA: Diagnosis not present

## 2019-11-12 DIAGNOSIS — M5416 Radiculopathy, lumbar region: Secondary | ICD-10-CM | POA: Diagnosis not present

## 2019-11-14 DIAGNOSIS — M5416 Radiculopathy, lumbar region: Secondary | ICD-10-CM | POA: Diagnosis not present

## 2019-11-26 ENCOUNTER — Encounter: Payer: Self-pay | Admitting: Emergency Medicine

## 2019-11-26 ENCOUNTER — Ambulatory Visit: Payer: Self-pay | Admitting: Emergency Medicine

## 2019-11-26 ENCOUNTER — Other Ambulatory Visit: Payer: Self-pay

## 2019-11-26 VITALS — BP 187/94 | HR 76 | Temp 98.3°F | Resp 14 | Ht 65.0 in | Wt 205.0 lb

## 2019-11-26 DIAGNOSIS — R030 Elevated blood-pressure reading, without diagnosis of hypertension: Secondary | ICD-10-CM

## 2019-11-26 NOTE — Progress Notes (Signed)
  ER Provider Note       Time seen: 4:23 PM    I have reviewed the vital signs and the nursing notes.  HISTORY   Chief Complaint No chief complaint on file.    HPI [name] is a 49 y.o. female with a history of anemia, cervical cancer, headache, CVA who presents today for possible hypertension.  Patient states she has been checking her blood pressure at home and readings have been elevated.  She has been having some ongoing hip and low back problems for which she is taking gabapentin.  Otherwise denies complaints.  Past Medical History:  Diagnosis Date  . Anemia    as a child  . Blood dyscrasia    "blood too thick"  . Cancer ([place])    cervix  . Headache    as a child  . Stroke ([place]) 1993   mild related to BCP and stress, and untreated HTN    Past Surgical History:  Procedure Laterality Date  . ANTERIOR AND POSTERIOR REPAIR N/A 01/28/2015   Procedure: ANTERIOR (CYSTOCELE) AND POSTERIOR REPAIR (RECTOCELE);  Surgeon: Molli Posey, MD;  Location: [facility];  Service: Gynecology;  Laterality: N/A;  . BLADDER SUSPENSION    . LAPAROSCOPIC SALPINGO OOPHERECTOMY Bilateral 01/28/2015   Procedure: LAPAROSCOPIC LYSIS OF ADHESIONS;  Surgeon: Molli Posey, MD;  Location: [facility];  Service: Gynecology;  Laterality: Bilateral;  . PARTIAL HYSTERECTOMY      Allergies Aspirin, Codeine, Penicillins, Demerol [meperidine], Kiwi extract, Percocet [oxycodone-acetaminophen], and Stadol [butorphanol]  Review of Systems Constitutional: Negative for fever. Cardiovascular: Negative for chest pain. Respiratory: Negative for shortness of breath. Gastrointestinal: Negative for abdominal pain, vomiting and diarrhea. Musculoskeletal: Positive for back and hip pain Skin: Negative for rash. Neurological: Negative for headaches, focal weakness or numbness.  All systems negative/normal/unremarkable except as stated in the HPI  ____________________________________________   PHYSICAL  EXAM:  VITAL SIGNS: Vitals:   11/26/19 1550  BP: (!) 187/94  Pulse: 76  Resp: 14  Temp: 98.3 F (36.8 C)  SpO2: 100%    Constitutional: Alert and oriented. Well appearing and in no distress. Eyes: Conjunctivae are normal. Normal extraocular movements. Cardiovascular: Normal rate, regular rhythm. No murmurs, rubs, or gallops. Respiratory: Normal respiratory effort without tachypnea nor retractions. Breath sounds are clear and equal bilaterally. No wheezes/rales/rhonchi. Musculoskeletal: Nontender with normal range of motion in extremities. No lower extremity tenderness nor edema. Neurologic:  Normal speech and language. No gross focal neurologic deficits are appreciated.  Skin:  Skin is warm, dry and intact. No rash noted. Psychiatric: Speech and behavior are normal.   DIFFERENTIAL DIAGNOSIS  Hypertension, medication side effect  ASSESSMENT AND PLAN  Elevated blood pressure reading   Plan: The patient had presented for hypertension.  Patient is asymptomatic hypertension which is likely a side effect of her gabapentin.  Otherwise she looks well and has a normal examination.  We will have her hold her gabapentin as she is already started weaning down on and begin an exercise regimen.  We will see her back in a month for recheck.  Lenise Arena MD    Note: This note was generated in part or whole with voice recognition software. Voice recognition is usually quite accurate but there are transcription errors that can and very often do occur. I apologize for any typographical errors that were not detected and corrected.

## 2019-12-23 ENCOUNTER — Other Ambulatory Visit: Payer: Self-pay

## 2019-12-23 ENCOUNTER — Encounter: Payer: Self-pay | Admitting: Emergency Medicine

## 2019-12-23 ENCOUNTER — Ambulatory Visit: Payer: Self-pay | Admitting: Emergency Medicine

## 2019-12-23 VITALS — BP 161/100 | HR 69 | Temp 99.0°F | Resp 14 | Ht 66.0 in | Wt 201.0 lb

## 2019-12-23 DIAGNOSIS — R030 Elevated blood-pressure reading, without diagnosis of hypertension: Secondary | ICD-10-CM

## 2019-12-23 NOTE — Progress Notes (Signed)
  Occupational Health Provider Note       Time seen: 3:50 PM    I have reviewed the vital signs and the nursing notes.  HISTORY   Chief Complaint No chief complaint on file.    HPI [name] is a 49 y.o. female with a history of anemia, cervical cancer, headache, CVA who presents today for hypertension.  Since her last visit, she has stopped gabapentin and feels better.  She brought in a list of her recent blood pressure readings which seem to be improved and better than her blood pressure today.  Past Medical History:  Diagnosis Date  . Anemia    as a child  . Blood dyscrasia    "blood too thick"  . Cancer ([place])    cervix  . Headache    as a child  . Stroke ([place]) 1993   mild related to BCP and stress, and untreated HTN    Past Surgical History:  Procedure Laterality Date  . ANTERIOR AND POSTERIOR REPAIR N/A 01/28/2015   Procedure: ANTERIOR (CYSTOCELE) AND POSTERIOR REPAIR (RECTOCELE);  Surgeon: Molli Posey, MD;  Location: [facility];  Service: Gynecology;  Laterality: N/A;  . BLADDER SUSPENSION    . LAPAROSCOPIC SALPINGO OOPHERECTOMY Bilateral 01/28/2015   Procedure: LAPAROSCOPIC LYSIS OF ADHESIONS;  Surgeon: Molli Posey, MD;  Location: [facility];  Service: Gynecology;  Laterality: Bilateral;  . PARTIAL HYSTERECTOMY      Allergies Aspirin, Codeine, Oxycodone-acetaminophen, Penicillins, Chocolate, Demerol [meperidine], Kiwi extract, Percocet [oxycodone-acetaminophen], and Stadol [butorphanol]  Review of Systems Constitutional: Negative for fever. Cardiovascular: Negative for chest pain. Respiratory: Negative for shortness of breath. Gastrointestinal: Negative for abdominal pain, vomiting and diarrhea. Musculoskeletal: Negative for back pain. Skin: Negative for rash. Neurological: Negative for headaches, focal weakness or numbness.  All systems negative/normal/unremarkable except as stated in the  HPI  ____________________________________________   PHYSICAL EXAM:  VITAL SIGNS: Vitals:   12/22/19 1542 12/23/19 1537  BP: (!) 129/90 (!) 161/100  Pulse:  69  Resp:  14  Temp:  99 F (37.2 C)  SpO2:  97%    Constitutional: Alert and oriented. Well appearing and in no distress. Eyes: Conjunctivae are normal. Normal extraocular movements. Cardiovascular: Normal rate, regular rhythm. No murmurs, rubs, or gallops. Respiratory: Normal respiratory effort without tachypnea nor retractions. Breath sounds are clear and equal bilaterally. No wheezes/rales/rhonchi. Gastrointestinal: Soft and nontender. Normal bowel sounds Musculoskeletal: Nontender with normal range of motion in extremities. No lower extremity tenderness nor edema. Neurologic:  Normal speech and language. No gross focal neurologic deficits are appreciated.  Skin:  Skin is warm, dry and intact. No rash noted.   ASSESSMENT AND PLAN  Elevated blood pressure   Plan: The patient had presented for recheck of her hypertension and the labs appear to be improving.  At this point I do not think she needs blood pressure medication, she can continue her diet and exercise and we will see her in 1 year unless she has further concerns.  Lenise Arena MD    Note: This note was generated in part or whole with voice recognition software. Voice recognition is usually quite accurate but there are transcription errors that can and very often do occur. I apologize for any typographical errors that were not detected and corrected.

## 2020-02-03 DIAGNOSIS — H40033 Anatomical narrow angle, bilateral: Secondary | ICD-10-CM | POA: Diagnosis not present

## 2020-04-19 DIAGNOSIS — Z6833 Body mass index (BMI) 33.0-33.9, adult: Secondary | ICD-10-CM | POA: Diagnosis not present

## 2020-04-19 DIAGNOSIS — Z01419 Encounter for gynecological examination (general) (routine) without abnormal findings: Secondary | ICD-10-CM | POA: Diagnosis not present

## 2020-04-19 DIAGNOSIS — Z1231 Encounter for screening mammogram for malignant neoplasm of breast: Secondary | ICD-10-CM | POA: Diagnosis not present

## 2020-06-10 ENCOUNTER — Other Ambulatory Visit: Payer: Self-pay

## 2020-06-10 DIAGNOSIS — Z1152 Encounter for screening for COVID-19: Secondary | ICD-10-CM

## 2020-06-10 NOTE — Progress Notes (Signed)
Presents to [facility] for outdoor specimen collection for covid test.  S/Sx since 06/08/20  Spouse tested positive for covid this week.  Non-Vaccinated  Has Mychart  AMD

## 2020-06-13 LAB — NOVEL CORONAVIRUS, NAA: SARS-CoV-2, NAA: DETECTED — AB

## 2020-06-13 LAB — SARS-COV-2, NAA 2 DAY TAT

## 2020-11-04 DIAGNOSIS — H40033 Anatomical narrow angle, bilateral: Secondary | ICD-10-CM | POA: Diagnosis not present

## 2021-05-11 DIAGNOSIS — Z1231 Encounter for screening mammogram for malignant neoplasm of breast: Secondary | ICD-10-CM | POA: Diagnosis not present

## 2021-05-11 DIAGNOSIS — Z6834 Body mass index (BMI) 34.0-34.9, adult: Secondary | ICD-10-CM | POA: Diagnosis not present

## 2021-05-11 DIAGNOSIS — Z01419 Encounter for gynecological examination (general) (routine) without abnormal findings: Secondary | ICD-10-CM | POA: Diagnosis not present

## 2021-08-23 DIAGNOSIS — H2513 Age-related nuclear cataract, bilateral: Secondary | ICD-10-CM | POA: Diagnosis not present

## 2021-08-23 DIAGNOSIS — H40033 Anatomical narrow angle, bilateral: Secondary | ICD-10-CM | POA: Diagnosis not present

## 2021-08-23 DIAGNOSIS — H35463 Secondary vitreoretinal degeneration, bilateral: Secondary | ICD-10-CM | POA: Diagnosis not present

## 2021-10-15 ENCOUNTER — Emergency Department (HOSPITAL_COMMUNITY): Payer: 59

## 2021-10-15 ENCOUNTER — Other Ambulatory Visit: Payer: Self-pay

## 2021-10-15 ENCOUNTER — Encounter (HOSPITAL_COMMUNITY): Payer: Self-pay | Admitting: Emergency Medicine

## 2021-10-15 ENCOUNTER — Emergency Department (HOSPITAL_COMMUNITY)
Admission: EM | Admit: 2021-10-15 | Discharge: 2021-10-15 | Disposition: A | Discharge status: Home / Self Care | Payer: 59 | Attending: Emergency Medicine | Admitting: Emergency Medicine

## 2021-10-15 DIAGNOSIS — L299 Pruritus, unspecified: Secondary | ICD-10-CM | POA: Diagnosis not present

## 2021-10-15 DIAGNOSIS — T7840XA Allergy, unspecified, initial encounter: Secondary | ICD-10-CM

## 2021-10-15 DIAGNOSIS — I160 Hypertensive urgency: Secondary | ICD-10-CM | POA: Diagnosis not present

## 2021-10-15 DIAGNOSIS — R197 Diarrhea, unspecified: Secondary | ICD-10-CM | POA: Diagnosis not present

## 2021-10-15 DIAGNOSIS — R079 Chest pain, unspecified: Secondary | ICD-10-CM | POA: Diagnosis not present

## 2021-10-15 DIAGNOSIS — R69 Illness, unspecified: Secondary | ICD-10-CM | POA: Diagnosis not present

## 2021-10-15 DIAGNOSIS — F419 Anxiety disorder, unspecified: Secondary | ICD-10-CM | POA: Diagnosis not present

## 2021-10-15 DIAGNOSIS — E876 Hypokalemia: Secondary | ICD-10-CM | POA: Insufficient documentation

## 2021-10-15 DIAGNOSIS — I10 Essential (primary) hypertension: Secondary | ICD-10-CM | POA: Diagnosis not present

## 2021-10-15 DIAGNOSIS — R0600 Dyspnea, unspecified: Secondary | ICD-10-CM | POA: Diagnosis not present

## 2021-10-15 LAB — COMPREHENSIVE METABOLIC PANEL
ALT: 38 U/L (ref 0–44)
AST: 26 U/L (ref 15–41)
Albumin: 3.9 g/dL (ref 3.5–5.0)
Alkaline Phosphatase: 100 U/L (ref 38–126)
Anion gap: 10 (ref 5–15)
BUN: 13 mg/dL (ref 6–20)
CO2: 24 mmol/L (ref 22–32)
Calcium: 9.6 mg/dL (ref 8.9–10.3)
Chloride: 105 mmol/L (ref 98–111)
Creatinine, Ser: 1.08 mg/dL — ABNORMAL HIGH (ref 0.44–1.00)
GFR, Estimated: >60 mL/min (ref >60)
Glucose, Bld: 91 mg/dL (ref 70–99)
Potassium: 3 mmol/L — ABNORMAL LOW (ref 3.5–5.1)
Sodium: 139 mmol/L (ref 135–145)
Total Bilirubin: 0.3 mg/dL (ref 0.3–1.2)
Total Protein: 6.5 g/dL (ref 6.5–8.1)

## 2021-10-15 LAB — CBC WITH DIFFERENTIAL/PLATELET
Abs Immature Granulocytes: 0.01 10*3/uL (ref 0.00–0.07)
Basophils Absolute: 0 10*3/uL (ref 0.0–0.1)
Basophils Relative: 0 %
Eosinophils Absolute: 0.1 10*3/uL (ref 0.0–0.5)
Eosinophils Relative: 0 %
HCT: 46 % (ref 36.0–46.0)
Hemoglobin: 16.4 g/dL — ABNORMAL HIGH (ref 12.0–15.0)
Immature Granulocytes: 0 %
Lymphocytes Relative: 26 %
Lymphs Abs: 3 10*3/uL (ref 0.7–4.0)
MCH: 31.2 pg (ref 26.0–34.0)
MCHC: 35.7 g/dL (ref 30.0–36.0)
MCV: 87.6 fL (ref 80.0–100.0)
Monocytes Absolute: 0.9 10*3/uL (ref 0.1–1.0)
Monocytes Relative: 8 %
Neutro Abs: 7.5 10*3/uL (ref 1.7–7.7)
Neutrophils Relative %: 66 %
Platelets: 311 10*3/uL (ref 150–400)
RBC: 5.25 MIL/uL — ABNORMAL HIGH (ref 3.87–5.11)
RDW: 12.1 % (ref 11.5–15.5)
WBC: 11.4 10*3/uL — ABNORMAL HIGH (ref 4.0–10.5)
nRBC: 0 % (ref 0.0–0.2)

## 2021-10-15 LAB — TROPONIN I (HIGH SENSITIVITY)
Troponin I (High Sensitivity): 4 ng/L (ref <18)
Troponin I (High Sensitivity): 4 ng/L (ref <18)

## 2021-10-15 MED ORDER — DIPHENHYDRAMINE HCL 25 MG PO CAPS
50.0000 mg | ORAL_CAPSULE | Freq: Once | ORAL | Status: DC
Start: 2021-10-15 — End: 2021-10-16
  Filled 2021-10-15: qty 2

## 2021-10-15 MED ORDER — SODIUM CHLORIDE 0.9 % IV BOLUS
1000.0000 mL | Freq: Once | INTRAVENOUS | Status: AC
  Administered 2021-10-15: 1000 mL via INTRAVENOUS

## 2021-10-15 MED ORDER — POTASSIUM CHLORIDE CRYS ER 20 MEQ PO TBCR
40.0000 meq | EXTENDED_RELEASE_TABLET | Freq: Once | ORAL | Status: AC
  Administered 2021-10-15: 40 meq via ORAL
  Filled 2021-10-15: qty 2

## 2021-10-15 MED ORDER — DIPHENHYDRAMINE HCL 50 MG/ML IJ SOLN
50.0000 mg | Freq: Once | INTRAMUSCULAR | Status: AC
  Administered 2021-10-15: 50 mg via INTRAVENOUS
  Filled 2021-10-15: qty 1

## 2021-10-15 MED ORDER — FAMOTIDINE IN NACL 20-0.9 MG/50ML-% IV SOLN
20.0000 mg | Freq: Once | INTRAVENOUS | Status: AC
  Administered 2021-10-15: 20 mg via INTRAVENOUS
  Filled 2021-10-15: qty 50

## 2021-10-15 MED ORDER — POTASSIUM CHLORIDE CRYS ER 20 MEQ PO TBCR: 20.0000 meq | EXTENDED_RELEASE_TABLET | Freq: Every day | ORAL | 0 refills | Status: DC

## 2021-10-15 MED ORDER — DEXAMETHASONE SODIUM PHOSPHATE 10 MG/ML IJ SOLN
10.0000 mg | Freq: Once | INTRAMUSCULAR | Status: AC
  Administered 2021-10-15: 10 mg via INTRAVENOUS
  Filled 2021-10-15: qty 1

## 2021-10-15 MED ORDER — EPINEPHRINE 0.3 MG/0.3ML IJ SOAJ: 0.3000 mg | INTRAMUSCULAR | 0 refills | Status: AC | PRN

## 2021-10-15 NOTE — ED Triage Notes (Signed)
Pt was bit by something on L lower leg around 10am. Around 1 hour later she started having palms itching, head itching, sob, diarrhea, and throat tightness.  Pt to treatment room.

## 2021-10-15 NOTE — ED Notes (Signed)
Patient verbalizes understanding of discharge instructions. Opportunity for questioning and answers were provided. Armband removed by staff, pt discharged from ED to home 

## 2021-10-15 NOTE — Discharge Instructions (Signed)
Your blood pressure was elevated today.  Make sure you log blood pressures at home and call your doctor on Monday for further evaluation and treatment.  Take Benadryl, 25-50 mg every 4 hours as needed for itching or rash.  If you develop new or worsening symptoms such as trouble breathing or swallowing, lip or tongue swelling, worsening rash, chest pain or tightness, vomiting, or any other new/concerning symptoms then return to the ER for evaluation.

## 2021-10-15 NOTE — ED Provider Notes (Signed)
[facility] EMERGENCY DEPARTMENT Provider Note   CSN: [id number] Arrival date & time: 10/15/21  1754     History  Chief Complaint  Patient presents with   Allergic Reaction    [name] is a 51 y.o. female.  HPI 50 year old female presents with concern for allergic reaction.  Around 10 AM she was bit by something in her garden that she is not sure what it was.  She states that sometime around 15 minutes later she started to feel intense itching, mostly around her scalp and her hands.  Went to the dentist and was feeling fine there but then afterwards started feeling bad again.  Has had on and off symptoms ever since.  She had a couple episodes of diarrhea during this time.  She also feels like her voice is changing and she is having a feeling like she went running like she has a tightness/burning in her chest.  She took a couple Benadryl about 30 min prior to arrival. Has noticed a rash to her left leg where she got bit. Feels like she has something stuck in her throat.  Home Medications Prior to Admission medications   Medication Sig Start Date End Date Taking? Authorizing Provider  EPINEPHrine 0.3 mg/0.3 mL IJ SOAJ injection Inject 0.3 mg into the muscle as needed for anaphylaxis. 10/15/21  Yes Sherwood Gambler, MD  potassium chloride SA (KLOR-CON M) 20 MEQ tablet Take 1 tablet (20 mEq total) by mouth daily. 10/15/21  Yes Sherwood Gambler, MD  albuterol (VENTOLIN HFA) 108 (90 Base) MCG/ACT inhaler albuterol sulfate HFA 90 mcg/actuation aerosol inhaler  INL 2 INHALATIONS ITL Q 6 H PRF WHZ 11/19/18   [provider]  ascorbic acid (VITAMIN C) 1000 MG tablet Vitamin C With Rose Hips 1,000 mg tablet    [provider]  b complex vitamins capsule Take 1 capsule by mouth daily.    [provider]  Biotin 10 MG CAPS biotin 10,000 mcg capsule    [provider]  Bromelain 100 MG TABS Bromelain    [provider]  Calcium  Citrate-Vitamin D (CALCIUM CITRATE + D) 250-200 MG-UNIT TABS Calcium Citrate + D    [provider]  cetirizine (ZYRTEC) 10 MG tablet Take 10 mg by mouth daily.    [provider]  Glucosamine-Chondroitin 1500-1200 MG/30ML LIQD Glucosamine Chondroitin    [provider]  magnesium citrate SOLN Take 1 Bottle by mouth once. Powder not solution    [provider]  Multiple Vitamin (MULTI-VITAMIN) tablet Take 1 tablet by mouth daily. Patient not taking: Reported on 12/23/2019    [provider]  Multiple Vitamins-Minerals (WOMENS ONE DAILY PO) Women's One Daily    [provider]      Allergies    Aspirin, Codeine, Oxycodone-acetaminophen, Penicillins, Chocolate, Demerol [meperidine], Kiwi extract, Percocet [oxycodone-acetaminophen], Prednisone, and Stadol [butorphanol]    Review of Systems   Review of Systems  HENT:  Positive for voice change.   Respiratory:  Positive for chest tightness and shortness of breath.   Gastrointestinal:  Positive for diarrhea.  Skin:  Positive for rash.   Physical Exam Updated Vital Signs BP (!) 161/85   Pulse 89   Temp 98.4 F (36.9 C) (Oral)   Resp (!) 24   Ht '5\' 6"'$  (1.676 m)   Wt 89.8 kg   SpO2 97%   BMI 31.96 kg/m  Physical Exam Vitals and nursing note reviewed.  Constitutional:  Appearance: She is well-developed.     Comments: Speaks clearly in full sentences.  No stridor appreciated.  HENT:     Head: Normocephalic and atraumatic.     Mouth/Throat:     Comments: Clear oropharynx including lips and tongue. Cardiovascular:     Rate and Rhythm: Normal rate and regular rhythm.     Heart sounds: Normal heart sounds.  Pulmonary:     Effort: Pulmonary effort is normal.     Breath sounds: Normal breath sounds. No stridor. No wheezing or rales.  Abdominal:     Palpations: Abdomen is soft.     Tenderness: There is no abdominal tenderness.  Skin:    General: Skin is warm and dry.      Findings: Rash present.     Comments: Irregular light erythema around the lower leg.  No overt insect sting or bite.  No other rash/hives.  Neurological:     Mental Status: She is alert.  Psychiatric:        Mood and Affect: Mood is anxious.    ED Results / Procedures / Treatments   Labs (all labs ordered are listed, but only abnormal results are displayed) Labs Reviewed  COMPREHENSIVE METABOLIC PANEL - Abnormal; Notable for the following components:      Result Value   Potassium 3.0 (*)    Creatinine, Ser 1.08 (*)    All other components within normal limits  CBC WITH DIFFERENTIAL/PLATELET - Abnormal; Notable for the following components:   WBC 11.4 (*)    RBC 5.25 (*)    Hemoglobin 16.4 (*)    All other components within normal limits  TROPONIN I (HIGH SENSITIVITY)  TROPONIN I (HIGH SENSITIVITY)    EKG EKG Interpretation  Date/Time:  Saturday Oct 15 2021 19:53:24 EDT Ventricular Rate:  79 PR Interval:  151 QRS Duration: 87 QT Interval:  384 QTC Calculation: 441 R Axis:   56 Text Interpretation: Sinus rhythm nonspecific St segments No old tracing to compare Confirmed by Sherwood Gambler [phone]) on 10/15/2021 9:13:35 PM  Radiology DG Chest Portable 1 View  Result Date: 10/15/2021 CLINICAL DATA:  Chest pain with dyspnea. EXAM: PORTABLE CHEST 1 VIEW COMPARISON:  None Available. FINDINGS: The heart size and mediastinal contours are within normal limits. Both lungs are clear. The visualized skeletal structures are unremarkable. IMPRESSION: No active disease. Electronically Signed   By: Ronney Asters M.D.   On: 10/15/2021 18:54    Procedures Procedures    Medications Ordered in ED Medications  diphenhydrAMINE (BENADRYL) capsule 50 mg (50 mg Oral Patient Refused/Not Given 10/15/21 2242)  sodium chloride 0.9 % bolus 1,000 mL (0 mLs Intravenous Stopped 10/15/21 2116)  diphenhydrAMINE (BENADRYL) injection 50 mg (50 mg Intravenous Given 10/15/21 1842)  dexamethasone (DECADRON)  injection 10 mg (10 mg Intravenous Given 10/15/21 1842)  famotidine (PEPCID) IVPB 20 mg premix (0 mg Intravenous Stopped 10/15/21 2115)  potassium chloride SA (KLOR-CON M) CR tablet 40 mEq (40 mEq Oral Given 10/15/21 2003)    ED Course/ Medical Decision Making/ A&P                           Medical Decision Making Amount and/or Complexity of Data Reviewed External Data Reviewed: notes. Labs: ordered. Radiology: ordered and independent interpretation performed. ECG/medicine tests: ordered and independent interpretation performed.  Risk Prescription drug management.   Patient presents with what is probably a allergic reaction from some sort of insect bite or sting.  She  reports that in the past she had to have an EpiPen for ant bites.  Not sure if she got bit by [place] today.  Her collection of symptoms could be mild anaphylaxis versus just an allergic reaction.  She is quite hypertensive on arrival and with her chest symptoms, chest x-ray, ECG and labs were obtained.  I personally viewed/interpreted the x-ray image and there is no pneumothorax.  No edema.  ECG shows nonspecific changes but no obvious ischemia on my interpretation.  Troponins are negative x2 and her blood pressure has slowly down trended with fluids, time, and supportive care for allergic reaction.  Her potassium is mildly low and she tolerated oral potassium and will be prescribed this.  The rash on her leg seems to be better after IV Benadryl and IV Decadron.  She has a reported allergic reaction to prednisone but has tolerated Decadron before so she was given this and I think this should cover her over the next couple days during the acute phase.  She will be prescribed an EpiPen though with her symptoms dramatically improving I do not think she needs one now.  Otherwise she is advised to continue Benadryl.  From a hypertension standpoint it sounds like she has elevated blood pressures chronically but her PCP has not put her on meds  yet.  I offered to put her on low-dose of something because her resting blood pressure is currently in the 160s but she wants to go home and keep checking it and logging and follow-up with her PCP which I do not think is unreasonable.  She was given return precautions.        Final Clinical Impression(s) / ED Diagnoses Final diagnoses:  Allergic reaction, initial encounter  Hypertensive urgency  Hypokalemia    Rx / DC Orders ED Discharge Orders          Ordered    potassium chloride SA (KLOR-CON M) 20 MEQ tablet  Daily        10/15/21 2230    EPINEPHrine 0.3 mg/0.3 mL IJ SOAJ injection  As needed        10/15/21 2230              Sherwood Gambler, MD 10/15/21 2303

## 2021-10-16 ENCOUNTER — Telehealth: Payer: Self-pay

## 2021-10-16 NOTE — Telephone Encounter (Signed)
Patient called in to say the [facility] computers were down last night and her script did not go through. Called in prescriptions as written by Dr Regenia Skeeter for Epinephrine and potessium

## 2022-07-17 ENCOUNTER — Ambulatory Visit: Payer: Self-pay | Admitting: Physician Assistant

## 2022-07-17 NOTE — Progress Notes (Unsigned)
BP check done on site after report of tracking BP elevated at home.  To return this week to see provider.  Tracking from [facility] at home received from pt with elevated readings.

## 2022-07-18 ENCOUNTER — Encounter: Payer: Self-pay | Admitting: Physician Assistant

## 2022-07-21 ENCOUNTER — Ambulatory Visit: Payer: Self-pay

## 2022-07-21 DIAGNOSIS — Z90710 Acquired absence of both cervix and uterus: Secondary | ICD-10-CM | POA: Insufficient documentation

## 2022-07-21 DIAGNOSIS — I639 Cerebral infarction, unspecified: Secondary | ICD-10-CM | POA: Insufficient documentation

## 2022-07-21 DIAGNOSIS — Z0000 Encounter for general adult medical examination without abnormal findings: Secondary | ICD-10-CM

## 2022-07-21 DIAGNOSIS — B392 Pulmonary histoplasmosis capsulati, unspecified: Secondary | ICD-10-CM | POA: Insufficient documentation

## 2022-07-21 LAB — POCT URINALYSIS DIPSTICK
Bilirubin, UA: NEGATIVE
Blood, UA: NEGATIVE
Glucose, UA: NEGATIVE
Ketones, UA: NEGATIVE
Leukocytes, UA: NEGATIVE
Nitrite, UA: NEGATIVE
Protein, UA: NEGATIVE
Spec Grav, UA: 1.01 (ref 1.010–1.025)
Urobilinogen, UA: 0.2 E.U./dL
pH, UA: 6 (ref 5.0–8.0)

## 2022-07-22 LAB — CMP12+LP+TP+TSH+6AC+CBC/D/PLT
ALT: 26 IU/L (ref 0–32)
AST: 15 IU/L (ref 0–40)
Albumin/Globulin Ratio: 2.3 — ABNORMAL HIGH (ref 1.2–2.2)
Albumin: 4.5 g/dL (ref 3.8–4.9)
Alkaline Phosphatase: 103 IU/L (ref 44–121)
BUN/Creatinine Ratio: 12 (ref 9–23)
BUN: 9 mg/dL (ref 6–24)
Basophils Absolute: 0 10*3/uL (ref 0.0–0.2)
Basos: 0 %
Bilirubin Total: 0.4 mg/dL (ref 0.0–1.2)
Calcium: 9.5 mg/dL (ref 8.7–10.2)
Chloride: 102 mmol/L (ref 96–106)
Chol/HDL Ratio: 4.2 ratio (ref 0.0–4.4)
Cholesterol, Total: 189 mg/dL (ref 100–199)
Creatinine, Ser: 0.74 mg/dL (ref 0.57–1.00)
EOS (ABSOLUTE): 0.1 10*3/uL (ref 0.0–0.4)
Eos: 1 %
Estimated CHD Risk: 1 times avg. (ref 0.0–1.0)
Free Thyroxine Index: 2.7 (ref 1.2–4.9)
GGT: 17 IU/L (ref 0–60)
Globulin, Total: 2 g/dL (ref 1.5–4.5)
Glucose: 77 mg/dL (ref 70–99)
HDL: 45 mg/dL (ref >39)
Hematocrit: 49 % — ABNORMAL HIGH (ref 34.0–46.6)
Hemoglobin: 16.9 g/dL — ABNORMAL HIGH (ref 11.1–15.9)
Immature Grans (Abs): 0 10*3/uL (ref 0.0–0.1)
Immature Granulocytes: 0 %
Iron: 86 ug/dL (ref 27–159)
LDH: 211 IU/L (ref 119–226)
LDL Chol Calc (NIH): 123 mg/dL — ABNORMAL HIGH (ref 0–99)
Lymphocytes Absolute: 2.4 10*3/uL (ref 0.7–3.1)
Lymphs: 36 %
MCH: 31.1 pg (ref 26.6–33.0)
MCHC: 34.5 g/dL (ref 31.5–35.7)
MCV: 90 fL (ref 79–97)
Monocytes Absolute: 0.5 10*3/uL (ref 0.1–0.9)
Monocytes: 8 %
Neutrophils Absolute: 3.6 10*3/uL (ref 1.4–7.0)
Neutrophils: 55 %
Phosphorus: 2.9 mg/dL — ABNORMAL LOW (ref 3.0–4.3)
Platelets: 320 10*3/uL (ref 150–450)
Potassium: 4.1 mmol/L (ref 3.5–5.2)
RBC: 5.44 x10E6/uL — ABNORMAL HIGH (ref 3.77–5.28)
RDW: 12.3 % (ref 11.7–15.4)
Sodium: 138 mmol/L (ref 134–144)
T3 Uptake Ratio: 28 % (ref 24–39)
T4, Total: 9.6 ug/dL (ref 4.5–12.0)
TSH: 1.08 u[IU]/mL (ref 0.450–4.500)
Total Protein: 6.5 g/dL (ref 6.0–8.5)
Triglycerides: 116 mg/dL (ref 0–149)
Uric Acid: 6 mg/dL (ref 3.0–7.2)
VLDL Cholesterol Cal: 21 mg/dL (ref 5–40)
WBC: 6.6 10*3/uL (ref 3.4–10.8)
eGFR: 98 mL/min/{1.73_m2} (ref >59)

## 2022-07-25 ENCOUNTER — Encounter: Payer: Self-pay | Admitting: Physician Assistant

## 2022-07-25 ENCOUNTER — Ambulatory Visit: Payer: Self-pay | Admitting: Physician Assistant

## 2022-07-25 VITALS — BP 130/88 | HR 73 | Temp 97.8°F | Resp 12 | Ht 66.0 in | Wt 200.0 lb

## 2022-07-25 DIAGNOSIS — Z0000 Encounter for general adult medical examination without abnormal findings: Secondary | ICD-10-CM

## 2022-07-25 DIAGNOSIS — E876 Hypokalemia: Secondary | ICD-10-CM

## 2022-07-25 NOTE — Progress Notes (Signed)
Has lost 28 lbs.  Took BP at home & it was 138/89 at 7:53 am

## 2022-07-25 NOTE — Progress Notes (Signed)
[facility]  ____________________________________________   None    (approximate)  I have reviewed the triage vital signs and the nursing notes.   HISTORY  Chief Complaint Annual Exam   HPI [name] is a 52 y.o. female patient presents annual physical exam.  Patient has physical labs facilities was in 2021.  Patient states she has noticed fluctuation in her blood pressure over the past 2 months.  Patient also states that has been over 20 pound weight loss secondary to diet and exercise in the past 3 months.  Patient denies chest pain or dyspnea.  States previous history of blood pressure fluctuation as far back as 4 years ago.  States always stabilized and she never taken any medication for blood pressure.         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   Lumbar radiculopathy 09/04/2019   Prolapsed lumbar disc 09/02/2019   Degeneration of lumbar intervertebral disc 08/19/2019   Lumbar pain 08/07/2019   Arthritis 03/05/2019   Asthma 03/05/2019   Pelvic pain in female 01/28/2015    Past Surgical History:  Procedure Laterality Date   ANTERIOR AND POSTERIOR REPAIR N/A 01/28/2015   Procedure: ANTERIOR (CYSTOCELE) AND POSTERIOR REPAIR (RECTOCELE);  Surgeon: Molli Posey, MD;  Location: [facility];  Service: Gynecology;  Laterality: N/A;   BLADDER SUSPENSION     LAPAROSCOPIC SALPINGO OOPHERECTOMY Bilateral 01/28/2015   Procedure: LAPAROSCOPIC LYSIS OF ADHESIONS;  Surgeon: Molli Posey, MD;  Location: [facility];  Service: Gynecology;   Laterality: Bilateral;   PARTIAL HYSTERECTOMY      Prior to Admission medications   Medication Sig Start Date End Date Taking? Authorizing Provider  ascorbic acid (VITAMIN C) 1000 MG tablet Vitamin C With Rose Hips 1,000 mg tablet   Yes [provider]  b complex vitamins capsule Take 1 capsule by mouth daily.   Yes [provider]  Calcium-Magnesium-Vitamin D (CALCIUM 1200+D3 PO) Take 1 tablet by mouth every evening.   Yes [provider]  cetirizine (ZYRTEC) 10 MG tablet Take 10 mg by mouth daily.   Yes [provider]  COLLAGEN PO Take 1 capsule by mouth 3 (three) times daily.   Yes [provider]  Evening Primrose Oil 500 MG CAPS Take 1 capsule by mouth 3 (three) times daily.   Yes [provider]  magnesium citrate SOLN Take 1 Bottle by mouth once. Powder not solution   Yes [provider]  PROBIOTIC, LACTOBACILLUS, PO Take 1 capsule by mouth every morning.   Yes [provider]  Turmeric (QC TUMERIC COMPLEX PO) Take 2 tablets by mouth 3 (three) times daily. Tumeric/Bromelain 450 mg - combination (Natural Factors)   Yes [provider]  Zinc 50 MG TABS Take 1 tablet by mouth every morning.   Yes [provider]  Cholecalciferol (VITAMIN D3) 250 MCG (10000 UT) CAPS Take 1 capsule by mouth every morning.    [provider]  EPINEPHrine 0.3 mg/0.3 mL IJ SOAJ  injection Inject 0.3 mg into the muscle as needed for anaphylaxis. 10/15/21   Sherwood Gambler, MD  potassium chloride SA (KLOR-CON M) 20 MEQ tablet Take 1 tablet (20 mEq total) by mouth daily. Patient not taking: Reported on 07/17/2022 10/15/21   Sherwood Gambler, MD    Allergies Aspirin, Codeine, Oxycodone-acetaminophen, Penicillins, Chocolate, Demerol [meperidine], Kiwi extract, Percocet [oxycodone-acetaminophen], Prednisone, and Stadol [butorphanol]  Family History  Problem Relation Age of Onset   Heart disease Mother    Liver disease  Mother    Diabetes Mother    Hepatitis B Mother    Stroke Mother    Heart disease Father    Diabetes Father    Fibromyalgia Father    Cancer - Ovarian Maternal Grandmother    Diabetes Maternal Aunt    Breast cancer Maternal Aunt    Cervical cancer Maternal Aunt     Social History Social History   Tobacco Use   Smoking status: Never   Smokeless tobacco: Never  Substance Use Topics   Alcohol use: No   Drug use: No    Review of Systems Constitutional: No fever/chills. Eyes: No visual changes. ENT: No sore throat. Cardiovascular: Denies chest pain. Respiratory: Denies shortness of breath.  Pulmonary histoplasmosis Gastrointestinal: No abdominal pain.  No nausea, no vomiting.  No diarrhea.  No constipation. Genitourinary: Negative for dysuria. Musculoskeletal: Negative for back pain. Skin: Negative for rash. Neurological: Negative for headaches, focal weakness or numbness. Hematological/Lymphatic:  Allergic/Immunilogical: Aspirin codeine oxycodone penicillin chocolate, Demerol, kiwi extract, Percocet, prednisone, and Stadol. ____________________________________________   PHYSICAL EXAM:  VITAL SIGNS: BP is 174/92, repeat blood pressure 130/88 pulse 78, respiration 12, temperature 97.8, patient is 98% O2 sat on room air.  Patient weighs 200 pounds and BMI is 32.28. Constitutional: Alert and oriented. Well appearing and in no acute distress. Eyes: Conjunctivae are normal. PERRL. EOMI. Head: Atraumatic. Nose: No congestion/rhinnorhea. Mouth/Throat: Mucous membranes are moist.  Oropharynx non-erythematous. Neck: No stridor.  No cervical spine tenderness to palpation. Hematological/Lymphatic/Immunilogical: No cervical lymphadenopathy. Cardiovascular: Normal rate, regular rhythm. Grossly normal heart sounds.  Good peripheral circulation. Respiratory: Normal respiratory effort.  No retractions. Lungs CTAB. Gastrointestinal: Soft and nontender. No distention. No abdominal  bruits. No CVA tenderness. Decubitus Genitourinary: Deferred Musculoskeletal: No lower extremity tenderness nor edema.  No joint effusions. Neurologic:  Normal speech and language. No gross focal neurologic deficits are appreciated. No gait instability. Skin:  Skin is warm, dry and intact. No rash noted. Psychiatric: Mood and affect are normal. Speech and behavior are normal.  ____________________________________________   LABS _     Component Ref Range & Units 4 d ago  Color, UA  Yellow  Clarity, UA  Clear  Glucose, UA Negative Negative  Bilirubin, UA  Negative  Ketones, UA  Negative  Spec Grav, UA 1.010 - 1.025 1.010  Blood, UA  Negative  pH, UA 5.0 - 8.0 6.0  Protein, UA Negative Negative  Urobilinogen, UA 0.2 or 1.0 E.U./dL 0.2  Nitrite, UA  Negative  Leukocytes, UA Negative Negative  Appearance    Odor           Specimen Collected: 07/21/22 14:29 Last Resulted: 07/21/22 14:29      Lab Flowsheet      Order Details      View Encounter      Lab and Collection Details      Routing      Result History    View All Conversations on this Encounter  Result Care Coordination   Patient Communication   Add Comments   Add Notifications  Back to Top      Other Results from 07/21/2022   Contains abnormal data CMP12+LP+TP+TSH+6AC+CBC/D/Plt Order: [id number] Status: Final result      Visible to patient: Yes (not seen)      Next appt: 07/27/2022 at 11:15 AM in [facility] Sable Feil, PA-C)      Dx: Annual physical exam    0 Result Notes              Component Ref Range & Units 4 d ago (07/21/22) 9 mo ago (10/15/21) 9 mo ago (10/15/21) 4 yr ago (12/20/17) 7 yr ago (01/29/15) 7 yr ago (01/25/15) 13 yr ago (09/29/08)  Glucose 70 - 99 mg/dL 77 91 CM       Uric Acid 3.0 - 7.2 mg/dL 6.0        Comment:            Therapeutic target for gout patients: <6.0  BUN 6 - 24 mg/dL 9 13 R       Creatinine, Ser 0.57 - 1.00 mg/dL 0.74 1.08 High  R       eGFR  >59 mL/min/1.73 98        BUN/Creatinine Ratio 9 - 23 12        Sodium 134 - 144 mmol/L 138 139 R       Potassium 3.5 - 5.2 mmol/L 4.1 3.0 Low  R       Chloride 96 - 106 mmol/L 102 105 R       Calcium 8.7 - 10.2 mg/dL 9.5 9.6 R       Phosphorus 3.0 - 4.3 mg/dL 2.9 Low         Total Protein 6.0 - 8.5 g/dL 6.5 6.5 R       Albumin 3.8 - 4.9 g/dL 4.5 3.9 R       Globulin, Total 1.5 - 4.5 g/dL 2.0        Albumin/Globulin Ratio 1.2 - 2.2 2.3 High         Bilirubin Total 0.0 - 1.2 mg/dL 0.4 0.3 R       Alkaline Phosphatase 44 - 121 IU/L 103 100 R       LDH 119 - 226 IU/L 211        AST 0 - 40 IU/L 15 26 R       ALT 0 - 32 IU/L 26 38 R       GGT 0 - 60 IU/L 17        Iron 27 - 159 ug/dL 86        Cholesterol, Total 100 - 199 mg/dL 189        Triglycerides 0 - 149 mg/dL 116   129 R     HDL >39 mg/dL 45   42 R     VLDL Cholesterol Cal 5 - 40 mg/dL 21        LDL Chol Calc (NIH) 0 - 99 mg/dL 123 High         Chol/HDL Ratio 0.0 - 4.4 ratio 4.2        Comment:                                   T. Chol/HDL Ratio  Men  Women                               1/2 Avg.Risk  3.4    3.3                                   Avg.Risk  5.0    4.4                                2X Avg.Risk  9.6    7.1                                3X Avg.Risk 23.4   11.0  Estimated CHD Risk 0.0 - 1.0 times avg. 1.0        Comment: The CHD Risk is based on the T. Chol/HDL ratio. Other factors affect CHD Risk such as hypertension, smoking, diabetes, severe obesity, and family history of premature CHD.  TSH 0.450 - 4.500 uIU/mL 1.080        T4, Total 4.5 - 12.0 ug/dL 9.6        T3 Uptake Ratio 24 - 39 % 28        Free Thyroxine Index 1.2 - 4.9 2.7        WBC 3.4 - 10.8 x10E3/uL 6.6  11.4 High  R  11.2 High  R 10.7 High  R 5.9 R  RBC 3.77 - 5.28 x10E6/uL 5.44 High   5.25 High  R  4.24 R 4.96 R 4.85 R  Hemoglobin 11.1 - 15.9 g/dL 16.9 High   16.4 High  R  13.0 R 15.4 High  R 15.5 High   R  Hematocrit 34.0 - 46.6 % 49.0 High   46.0 R  38.3 R 44.1 R 44.8 R  MCV 79 - 97 fL 90  87.6 R  90.3 R 88.9 R 92.3 R  MCH 26.6 - 33.0 pg 31.1  31.2 R  30.7 R 31.0 R   MCHC 31.5 - 35.7 g/dL 34.5  35.7 R  33.9 R 34.9 R 34.6 R  RDW 11.7 - 15.4 % 12.3  12.1 R  12.8 R 12.6 R 12.3 R  Platelets 150 - 450 x10E3/uL 320  311 R  264 R 278 R 262 R  Neutrophils Not Estab. % 55  66 R      Lymphs Not Estab. % 36        Monocytes Not Estab. % 8        Eos Not Estab. % 1        Basos Not Estab. % 0        Neutrophils Absolute 1.4 - 7.0 x10E3/uL 3.6  7.5 R      Lymphocytes Absolute 0.7 - 3.1 x10E3/uL 2.4  3.0 R      Monocytes Absolute 0.1 - 0.9 x10E3/uL 0.5        EOS (ABSOLUTE) 0.0 - 0.4 x10E3/uL 0.1        Basophils Absolute 0.0 - 0.2 x10E3/uL 0.0  0.0 R      Immature Granulocytes Not Estab. % 0  0 R                    ___________________________________________  EKG  Normal sinus rhythm at 65 bpm ____________________________________________    ____________________________________________   INITIAL IMPRESSION / ASSESSMENT AND PLAN  As part of my medical decision making, I reviewed the following data within the electronic MEDICAL RECORD NUMBER      No acute findings on physical exam.  Discussed labs consistent with hypokalemia.  Advised to restart potassium.  Advised 3-day blood pressure check and reevaluation.     ____________________________________________   FINAL CLINICAL IMPRESSION  Well exam ED Discharge Orders     None        Note:  This document was prepared using Dragon voice recognition software and may include unintentional dictation errors.

## 2022-07-26 ENCOUNTER — Ambulatory Visit: Payer: Self-pay

## 2022-07-26 VITALS — BP 148/88 | HR 62 | Resp 12

## 2022-07-26 DIAGNOSIS — Z0130 Encounter for examination of blood pressure without abnormal findings: Secondary | ICD-10-CM

## 2022-07-27 ENCOUNTER — Encounter: Payer: Self-pay | Admitting: Physician Assistant

## 2022-07-27 ENCOUNTER — Ambulatory Visit: Payer: Self-pay | Admitting: Physician Assistant

## 2022-07-27 VITALS — BP 170/89 | HR 63 | Temp 97.7°F | Resp 12

## 2022-07-27 DIAGNOSIS — I10 Essential (primary) hypertension: Secondary | ICD-10-CM

## 2022-07-27 MED ORDER — POTASSIUM CHLORIDE CRYS ER 20 MEQ PO TBCR: 20.0000 meq | EXTENDED_RELEASE_TABLET | Freq: Every day | ORAL | 3 refills | Status: DC

## 2022-07-27 MED ORDER — HYDROCHLOROTHIAZIDE 25 MG PO TABS: 25.0000 mg | ORAL_TABLET | Freq: Every day | ORAL | 0 refills | Status: DC

## 2022-07-27 NOTE — Progress Notes (Signed)
   Subjective: Elevated blood pressure    Patient ID: [name], female    DOB: 02/09/1971, 52 y.o.   MRN: [id number]  HPI Patient is here today status post 3 days blood pressure readings secondary to elevated blood pressure on physical exam.  Patient denies chest pain, shortness of breath, or vertigo.   Review of Systems Asthma history of pulmonary histoplasmosis    Objective:   Physical Exam  BP is 170/89, pulse 63, respiration 12, temperature 97.7, patient is 100 send O2 sat on room air. HEENT is unremarkable.  Neck is supple for lymphadenopathy or bruits. Heart is regular rate and rhythm. Average blood pressure for 3 days 149/88.      Assessment & Plan:   Hypertension patient is amenable to a trial of HCTZ at 25 mg daily will follow-up in 2 weeks.  Sooner if condition worsens.

## 2022-08-03 DIAGNOSIS — Z1231 Encounter for screening mammogram for malignant neoplasm of breast: Secondary | ICD-10-CM | POA: Diagnosis not present

## 2022-08-03 DIAGNOSIS — Z6832 Body mass index (BMI) 32.0-32.9, adult: Secondary | ICD-10-CM | POA: Diagnosis not present

## 2022-08-03 DIAGNOSIS — Z01419 Encounter for gynecological examination (general) (routine) without abnormal findings: Secondary | ICD-10-CM | POA: Diagnosis not present

## 2022-08-10 ENCOUNTER — Ambulatory Visit: Payer: Self-pay | Admitting: Physician Assistant

## 2022-08-10 ENCOUNTER — Encounter: Payer: Self-pay | Admitting: Physician Assistant

## 2022-08-10 VITALS — BP 142/90 | HR 62 | Temp 97.5°F | Resp 12

## 2022-08-10 DIAGNOSIS — I10 Essential (primary) hypertension: Secondary | ICD-10-CM

## 2022-08-10 NOTE — Progress Notes (Signed)
   Subjective: Hypertension    Patient ID: [name], female    DOB: 1970/10/24, 52 y.o.   MRN: [id number]  HPI Patient follow-up for hypertension status post 2 weeks of starting HCTZ 25 mg.  Patient states she has noticed weight loss since taking medication.  States using home blood pressure monitoring has shown a decrease in her blood pressure.   Review of Systems Asthma, CVA, and pulmonary histoplasmosis.    Objective:   Physical Exam BP is 142/90, pulse 62, respiration 12, temperature 97.5, and patient 1% O2 sat on room air. HEENT was unremarkable. Lungs are clear to auscultation. Heart was regular rate and rhythm.       Assessment & Plan:   Patient advised to continue HCTZ 25 mg for 6 months before reevaluation.  Return back to clinic if condition worsens.

## 2022-08-10 NOTE — Progress Notes (Signed)
BP Readings from home:  08/09/22    5:30 PM    125/84   P = 66 08/10/22    7:03 AM    121/88   P = 73  AMD

## 2022-08-11 IMAGING — DX DG CHEST 1V PORT
1 series · 1 of 1 positions shown · non-contrast
Comparison: None Available.

CLINICAL DATA: Chest pain with dyspnea.

EXAM:
PORTABLE CHEST 1 VIEW

[chest]
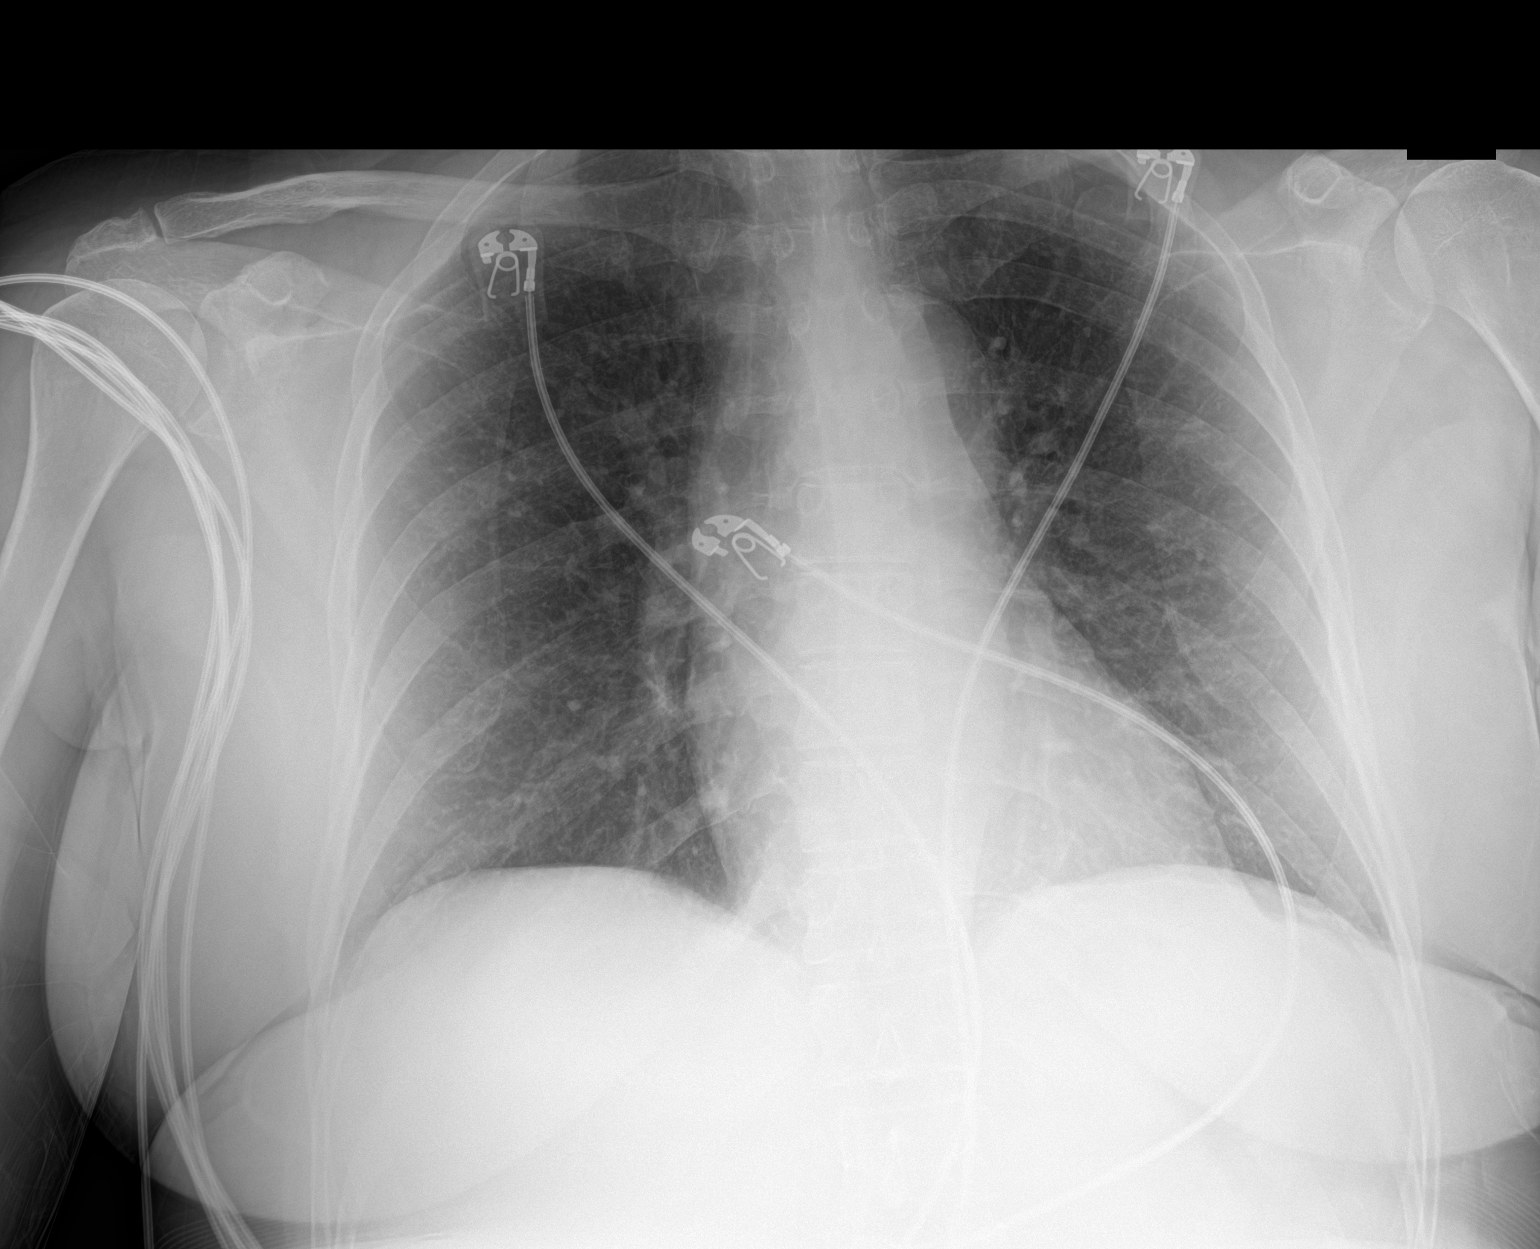

[1 of 1 positions shown; findings below may reference images not displayed]

FINDINGS: The heart size and mediastinal contours are within normal limits.
Both lungs are clear. The visualized skeletal structures are
unremarkable.
IMPRESSION: No active disease.

## 2022-08-15 DIAGNOSIS — H40033 Anatomical narrow angle, bilateral: Secondary | ICD-10-CM | POA: Diagnosis not present

## 2022-10-02 DIAGNOSIS — H04123 Dry eye syndrome of bilateral lacrimal glands: Secondary | ICD-10-CM | POA: Diagnosis not present

## 2022-10-27 ENCOUNTER — Other Ambulatory Visit: Payer: Self-pay

## 2022-10-27 DIAGNOSIS — I10 Essential (primary) hypertension: Secondary | ICD-10-CM

## 2022-10-27 MED ORDER — HYDROCHLOROTHIAZIDE 25 MG PO TABS: 25.0000 mg | ORAL_TABLET | Freq: Every day | ORAL | 1 refills | Status: DC

## 2022-12-02 ENCOUNTER — Encounter: Payer: Self-pay | Admitting: Physician Assistant

## 2022-12-04 ENCOUNTER — Other Ambulatory Visit: Payer: Self-pay | Admitting: Physician Assistant

## 2022-12-04 MED ORDER — POTASSIUM CHLORIDE CRYS ER 20 MEQ PO TBCR: 20.0000 meq | EXTENDED_RELEASE_TABLET | Freq: Every day | ORAL | 3 refills | Status: DC

## 2022-12-05 ENCOUNTER — Other Ambulatory Visit: Payer: Self-pay

## 2022-12-05 DIAGNOSIS — E876 Hypokalemia: Secondary | ICD-10-CM

## 2022-12-05 MED ORDER — POTASSIUM CHLORIDE CRYS ER 20 MEQ PO TBCR: 20.0000 meq | EXTENDED_RELEASE_TABLET | Freq: Every day | ORAL | 3 refills | Status: DC

## 2022-12-26 ENCOUNTER — Ambulatory Visit: Payer: 59 | Admitting: Physician Assistant

## 2022-12-26 DIAGNOSIS — M545 Low back pain, unspecified: Secondary | ICD-10-CM | POA: Diagnosis not present

## 2023-01-01 DIAGNOSIS — M5451 Vertebrogenic low back pain: Secondary | ICD-10-CM | POA: Diagnosis not present

## 2023-02-06 ENCOUNTER — Encounter: Payer: Self-pay | Admitting: Physician Assistant

## 2023-02-06 ENCOUNTER — Ambulatory Visit: Payer: Self-pay | Admitting: Physician Assistant

## 2023-02-06 VITALS — BP 148/86 | HR 58 | Resp 14 | Ht 65.0 in | Wt 183.0 lb

## 2023-02-06 DIAGNOSIS — I10 Essential (primary) hypertension: Secondary | ICD-10-CM

## 2023-02-06 NOTE — Progress Notes (Signed)
   Subjective: Blood pressure check    Patient ID: EVERLENE ITURRALDE, female    DOB: 1971-02-08, 52 y.o.   MRN: [id number]  HPI Patient presents today for 30-month blood pressure check.  Patient was started on HCTZ 25 mg along with potassium 20 mEq 6 months ago.  Patient states compliance with medications.   Review of Systems Asthma, cerebrovascular accident, and pulmonary histoplasmosis.  Chronic back pain.    Objective:   Physical Exam      BP 146/97 148/92 148/86  BP Location Left Arm Right Arm   Pulse 65 58   Resp 14    SpO2 98 %    Weight 183 lb (83 kg)    Height 5\' 5"  (1.651 m)            Assessment & Plan: Hypertension  Advised to repeat 3-day blood pressure check this week.

## 2023-02-06 NOTE — Progress Notes (Signed)
Pt presents today for 6 month check up for BP.

## 2023-02-07 ENCOUNTER — Ambulatory Visit: Payer: Self-pay

## 2023-02-07 VITALS — BP 156/100 | HR 82 | Resp 14

## 2023-02-07 DIAGNOSIS — Z0130 Encounter for examination of blood pressure without abnormal findings: Secondary | ICD-10-CM

## 2023-02-07 NOTE — Progress Notes (Signed)
Pt presents today for first  BP Check. Pt states she is under a lot of stress today.

## 2023-02-08 ENCOUNTER — Encounter: Payer: Self-pay | Admitting: Physician Assistant

## 2023-02-09 ENCOUNTER — Other Ambulatory Visit: Payer: Self-pay | Admitting: Physician Assistant

## 2023-03-08 ENCOUNTER — Other Ambulatory Visit: Payer: Self-pay | Admitting: Physician Assistant

## 2023-04-11 ENCOUNTER — Other Ambulatory Visit: Payer: Self-pay

## 2023-04-11 DIAGNOSIS — R0981 Nasal congestion: Secondary | ICD-10-CM

## 2023-04-11 DIAGNOSIS — R519 Headache, unspecified: Secondary | ICD-10-CM

## 2023-04-11 DIAGNOSIS — R051 Acute cough: Secondary | ICD-10-CM

## 2023-04-11 MED ORDER — FEXOFENADINE-PSEUDOEPHED ER 60-120 MG PO TB12: 1.0000 | ORAL_TABLET | Freq: Two times a day (BID) | ORAL | 0 refills | Status: DC

## 2023-04-11 MED ORDER — BENZONATATE 200 MG PO CAPS: 200.0000 mg | ORAL_CAPSULE | Freq: Three times a day (TID) | ORAL | 0 refills | Status: DC | PRN

## 2023-04-11 NOTE — Telephone Encounter (Signed)
S/Sx started this past Wednesday & progressively worsening: Started with a scratchy throat - resolved Equilibrium felt off on Friday Felt like congestion moved to chest on Sunday - has improved C/O productive cough - dark yellow phlegm - worse at night & first thing in the mornings Headache - front of head Sinus pain/pressure around the eyes Sinus drainage Denies ear discomfort  OTC Meds:  Mucinex DM, Zyrtec, Dayquil & Nyquil  Lives in [place] & can't get to the clinic today.  Calling to see if something can be prescribed for her & sent to her pharmacy.  Review above information with Nona Dell, PA-C.  Orders received for Allegra D bid x10 days & Tessalon Pearls 200 mg tid #30.  [name] requests Rx's be sent to [facility] on [address] in [place], [place].  AMD

## 2023-04-23 ENCOUNTER — Other Ambulatory Visit: Payer: Self-pay

## 2023-04-23 DIAGNOSIS — E876 Hypokalemia: Secondary | ICD-10-CM

## 2023-04-23 DIAGNOSIS — I10 Essential (primary) hypertension: Secondary | ICD-10-CM

## 2023-04-23 MED ORDER — HYDROCHLOROTHIAZIDE 25 MG PO TABS: 25.0000 mg | ORAL_TABLET | Freq: Every day | ORAL | 3 refills | Status: DC

## 2023-04-23 MED ORDER — POTASSIUM CHLORIDE CRYS ER 20 MEQ PO TBCR: 20.0000 meq | EXTENDED_RELEASE_TABLET | Freq: Every day | ORAL | 3 refills | Status: DC

## 2023-08-06 DIAGNOSIS — Z6833 Body mass index (BMI) 33.0-33.9, adult: Secondary | ICD-10-CM | POA: Diagnosis not present

## 2023-08-06 DIAGNOSIS — Z01419 Encounter for gynecological examination (general) (routine) without abnormal findings: Secondary | ICD-10-CM | POA: Diagnosis not present

## 2023-08-06 DIAGNOSIS — Z1231 Encounter for screening mammogram for malignant neoplasm of breast: Secondary | ICD-10-CM | POA: Diagnosis not present

## 2023-08-21 DIAGNOSIS — H40033 Anatomical narrow angle, bilateral: Secondary | ICD-10-CM | POA: Diagnosis not present

## 2023-09-03 DIAGNOSIS — H40033 Anatomical narrow angle, bilateral: Secondary | ICD-10-CM | POA: Diagnosis not present

## 2023-10-05 DIAGNOSIS — Z1211 Encounter for screening for malignant neoplasm of colon: Secondary | ICD-10-CM | POA: Diagnosis not present

## 2023-10-05 DIAGNOSIS — K635 Polyp of colon: Secondary | ICD-10-CM | POA: Diagnosis not present

## 2023-10-26 DIAGNOSIS — R030 Elevated blood-pressure reading, without diagnosis of hypertension: Secondary | ICD-10-CM | POA: Insufficient documentation

## 2023-10-26 DIAGNOSIS — Z8673 Personal history of transient ischemic attack (TIA), and cerebral infarction without residual deficits: Secondary | ICD-10-CM | POA: Insufficient documentation

## 2023-10-26 DIAGNOSIS — I10 Essential (primary) hypertension: Secondary | ICD-10-CM | POA: Insufficient documentation

## 2023-10-29 DIAGNOSIS — Z0000 Encounter for general adult medical examination without abnormal findings: Secondary | ICD-10-CM

## 2023-11-28 ENCOUNTER — Ambulatory Visit: Payer: Self-pay

## 2023-11-28 DIAGNOSIS — Z0000 Encounter for general adult medical examination without abnormal findings: Secondary | ICD-10-CM

## 2023-11-28 LAB — POCT URINALYSIS DIPSTICK
Bilirubin, UA: NEGATIVE
Blood, UA: NEGATIVE
Glucose, UA: NEGATIVE
Ketones, UA: NEGATIVE
Leukocytes, UA: NEGATIVE
Nitrite, UA: NEGATIVE
Protein, UA: NEGATIVE
Spec Grav, UA: 1.01 (ref 1.010–1.025)
Urobilinogen, UA: 0.2 U/dL
pH, UA: 8 (ref 5.0–8.0)

## 2023-11-29 LAB — CMP12+LP+TP+TSH+6AC+CBC/D/PLT
ALT: 38 IU/L — ABNORMAL HIGH (ref 0–32)
AST: 24 IU/L (ref 0–40)
Albumin: 4.9 g/dL (ref 3.8–4.9)
Alkaline Phosphatase: 117 IU/L (ref 44–121)
BUN/Creatinine Ratio: 16 (ref 9–23)
BUN: 12 mg/dL (ref 6–24)
Basophils Absolute: 0 10*3/uL (ref 0.0–0.2)
Basos: 1 %
Bilirubin Total: 0.5 mg/dL (ref 0.0–1.2)
Calcium: 10 mg/dL (ref 8.7–10.2)
Chloride: 99 mmol/L (ref 96–106)
Chol/HDL Ratio: 5 ratio — ABNORMAL HIGH (ref 0.0–4.4)
Cholesterol, Total: 247 mg/dL — ABNORMAL HIGH (ref 100–199)
Creatinine, Ser: 0.77 mg/dL (ref 0.57–1.00)
EOS (ABSOLUTE): 0.1 10*3/uL (ref 0.0–0.4)
Eos: 1 %
Estimated CHD Risk: 1.3 times avg. — ABNORMAL HIGH (ref 0.0–1.0)
Free Thyroxine Index: 2.8 (ref 1.2–4.9)
GGT: 22 IU/L (ref 0–60)
Globulin, Total: 2.5 g/dL (ref 1.5–4.5)
Glucose: 107 mg/dL — ABNORMAL HIGH (ref 70–99)
HDL: 49 mg/dL (ref >39)
Hematocrit: 52 % — ABNORMAL HIGH (ref 34.0–46.6)
Hemoglobin: 17.8 g/dL — ABNORMAL HIGH (ref 11.1–15.9)
Immature Grans (Abs): 0 10*3/uL (ref 0.0–0.1)
Immature Granulocytes: 0 %
Iron: 115 ug/dL (ref 27–159)
LDH: 276 IU/L — ABNORMAL HIGH (ref 119–226)
LDL Chol Calc (NIH): 173 mg/dL — ABNORMAL HIGH (ref 0–99)
Lymphocytes Absolute: 2.1 10*3/uL (ref 0.7–3.1)
Lymphs: 38 %
MCH: 31 pg (ref 26.6–33.0)
MCHC: 34.2 g/dL (ref 31.5–35.7)
MCV: 90 fL (ref 79–97)
Monocytes Absolute: 0.3 10*3/uL (ref 0.1–0.9)
Monocytes: 6 %
Neutrophils Absolute: 3 10*3/uL (ref 1.4–7.0)
Neutrophils: 54 %
Phosphorus: 2.5 mg/dL — ABNORMAL LOW (ref 3.0–4.3)
Platelets: 331 10*3/uL (ref 150–450)
Potassium: 3.9 mmol/L (ref 3.5–5.2)
RBC: 5.75 x10E6/uL — ABNORMAL HIGH (ref 3.77–5.28)
RDW: 12 % (ref 11.7–15.4)
Sodium: 142 mmol/L (ref 134–144)
T3 Uptake Ratio: 25 % (ref 24–39)
T4, Total: 11 ug/dL (ref 4.5–12.0)
TSH: 1.52 u[IU]/mL (ref 0.450–4.500)
Total Protein: 7.4 g/dL (ref 6.0–8.5)
Triglycerides: 137 mg/dL (ref 0–149)
Uric Acid: 7.7 mg/dL — ABNORMAL HIGH (ref 3.0–7.2)
VLDL Cholesterol Cal: 25 mg/dL (ref 5–40)
WBC: 5.6 10*3/uL (ref 3.4–10.8)
eGFR: 93 mL/min/{1.73_m2} (ref >59)

## 2023-12-05 ENCOUNTER — Encounter: Payer: Self-pay | Admitting: Physician Assistant

## 2023-12-05 ENCOUNTER — Ambulatory Visit: Payer: Self-pay | Admitting: Physician Assistant

## 2023-12-05 VITALS — BP 172/95 | HR 96 | Resp 16 | Ht 65.0 in | Wt 208.0 lb

## 2023-12-05 DIAGNOSIS — Z0000 Encounter for general adult medical examination without abnormal findings: Secondary | ICD-10-CM

## 2023-12-05 LAB — POCT URINALYSIS DIPSTICK
Bilirubin, UA: NEGATIVE
Blood, UA: NEGATIVE
Glucose, UA: NEGATIVE
Ketones, UA: NEGATIVE
Leukocytes, UA: NEGATIVE
Nitrite, UA: NEGATIVE
Protein, UA: NEGATIVE
Spec Grav, UA: 1.01 (ref 1.010–1.025)
Urobilinogen, UA: 0.2 U/dL
pH, UA: 7 (ref 5.0–8.0)

## 2023-12-05 NOTE — Progress Notes (Signed)
 [facility]  ____________________________________________   None    (approximate)  I have reviewed the triage vital signs and the nursing notes.   HISTORY  Chief Complaint Annual Exam   HPI [name] is a 53 y.o. female patient presents for annual physical exam.  Patient voices no concerns or complaints.         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   Lumbar radiculopathy 09/04/2019   Prolapsed lumbar disc 09/02/2019   Degeneration of lumbar intervertebral disc 08/19/2019   Lumbar pain 08/07/2019   Arthritis 03/05/2019   Asthma 03/05/2019   Pelvic pain in female 01/28/2015    Past Surgical History:  Procedure Laterality Date   ANTERIOR AND POSTERIOR REPAIR N/A 01/28/2015   Procedure: ANTERIOR (CYSTOCELE) AND POSTERIOR REPAIR (RECTOCELE);  Surgeon: Charlie Croak, MD;  Location: [facility];  Service: Gynecology;  Laterality: N/A;   BLADDER SUSPENSION     LAPAROSCOPIC SALPINGO OOPHERECTOMY Bilateral 01/28/2015   Procedure: LAPAROSCOPIC LYSIS OF ADHESIONS;  Surgeon: Charlie Croak, MD;  Location: [facility];  Service: Gynecology;  Laterality: Bilateral;   PARTIAL HYSTERECTOMY      Prior to Admission medications   Medication Sig Start Date End Date Taking? Authorizing Provider  ascorbic acid (VITAMIN C) 1000 MG tablet Vitamin C With Rose Hips 1,000 mg tablet   Yes [provider]  b complex vitamins capsule Take 1 capsule by mouth daily.   Yes [provider]  Calcium-Magnesium-Vitamin D (CALCIUM 1200+D3 PO) Take 1 tablet by mouth every evening.   Yes [provider]  Cholecalciferol (VITAMIN D3) 250 MCG (10000 UT) CAPS Take 1 capsule by mouth every morning.   Yes [provider]  COLLAGEN PO Take 1 capsule by mouth 3 (three) times daily.   Yes [provider]  EPINEPHrine  0.3 mg/0.3 mL IJ SOAJ injection Inject 0.3 mg into the muscle as needed for anaphylaxis. 10/15/21  Yes Freddi Hamilton, MD  Evening Primrose Oil 500 MG CAPS Take 1 capsule by mouth 3 (three) times daily.   Yes [provider]  hydrochlorothiazide  (HYDRODIURIL ) 25 MG tablet Take 1 tablet (25 mg total) by mouth daily. 04/23/23  Yes Claudene Tanda POUR, PA-C  magnesium citrate SOLN Take 1 Bottle by mouth once. Powder not solution   Yes [provider]  potassium chloride  SA (KLOR-CON  M) 20 MEQ tablet Take 1 tablet (20 mEq total) by mouth daily. 04/23/23  Yes Claudene Tanda POUR, PA-C  PROBIOTIC, LACTOBACILLUS, PO Take 1 capsule by mouth every morning.   Yes [provider]  Turmeric (QC TUMERIC COMPLEX PO) Take 2 tablets by mouth 3 (three) times daily. Tumeric/Bromelain 450 mg - combination (Natural Factors)   Yes [provider]  Zinc 50 MG TABS Take 1 tablet by mouth every morning.   Yes [provider]  benzonatate  (TESSALON ) 200 MG capsule  Take 1 capsule (200 mg total) by mouth 3 (three) times daily as needed for cough. 04/11/23   Claudene Tanda POUR, PA-C  fexofenadine -pseudoephedrine (ALLEGRA-D) 60-120 MG 12 hr tablet Take 1 tablet by mouth 2 (two) times daily. 04/11/23   Claudene Tanda POUR, PA-C    Allergies Aspirin, Codeine, Oxycodone -acetaminophen , Penicillins, Chocolate, Demerol [meperidine], Kiwi extract, Percocet [oxycodone -acetaminophen ], Prednisone, and Stadol  [butorphanol ]  Family History  Problem Relation Age  of Onset   Heart disease Mother    Liver disease Mother    Diabetes Mother    Hepatitis B Mother    Stroke Mother    Heart disease Father    Diabetes Father    Fibromyalgia Father    Cancer - Ovarian Maternal Grandmother    Diabetes Maternal Aunt    Breast cancer Maternal Aunt    Cervical cancer Maternal Aunt     Social History Social History   Tobacco Use   Smoking status: Never   Smokeless tobacco: Never  Substance Use Topics   Alcohol use: No   Drug use: No    Review of Systems Constitutional: No fever/chills Eyes: No visual changes. ENT: No sore throat. Cardiovascular: Denies chest pain. Respiratory: Denies shortness of breath. Gastrointestinal: No abdominal pain.  No nausea, no vomiting.  No diarrhea.  No constipation. Genitourinary: Negative for dysuria. Musculoskeletal: Negative for back pain. Skin: Negative for rash. Neurological: Negative for headaches, focal weakness or numbness.:  Endocrine: Hypertension Allergic/Immunilogical: Aspirin, codeine, penicillin, chocolate, Demerol, QB extract, prednisone, and Stadol . ____________________________________________   PHYSICAL EXAM:  VITAL SIGNS: BP 172/95  Cuff Size Large  Pulse Rate 96  Weight 208 lb (94.3 kg)  Height 5' 5 (1.651 m)  Resp 16  SpO2 96 %   BMI: 34.61 kg/m2  BSA: 2.08 m2    Constitutional: Alert and oriented. Well appearing and in no acute distress. Eyes: Conjunctivae are normal. PERRL. EOMI. Head: Atraumatic. Nose: No congestion/rhinnorhea. Mouth/Throat: Mucous membranes are moist.  Oropharynx non-erythematous. Neck: No stridor. No cervical spine tenderness to palpation. Hematological/Lymphatic/Immunilogical: No cervical lymphadenopathy. Cardiovascular: Normal rate, regular rhythm. Grossly normal heart sounds.  Good peripheral circulation. Respiratory: Normal respiratory effort.  No retractions. Lungs CTAB. Gastrointestinal: Soft and nontender. No distention. No abdominal bruits. No  CVA tenderness. Genitourinary: Deferred Musculoskeletal: No lower extremity tenderness nor edema.  No joint effusions. Neurologic:  Normal speech and language. No gross focal neurologic deficits are appreciated. No gait instability. Skin:  Skin is warm, dry and intact. No rash noted. Psychiatric: Mood and affect are normal. Speech and behavior are normal.  ____________________________________________   LABS     Component Ref Range & Units (hover) 7 d ago 1 yr ago  Color, UA yellow Yellow  Clarity, UA clear Clear  Glucose, UA Negative Negative  Bilirubin, UA neg Negative  Ketones, UA neg Negative  Spec Grav, UA 1.010 1.010  Blood, UA neg Negative  pH, UA 8.0 6.0  Protein, UA Negative Negative  Urobilinogen, UA 0.2 0.2  Nitrite, UA neg Negative  Leukocytes, UA Negative Negative  Appearance    Odor                 Component Ref Range & Units (hover) 7 d ago (11/28/23) 1 yr ago (07/21/22) 2 yr ago (10/15/21) 2 yr ago (10/15/21) 5 yr ago (12/20/17) 8 yr ago (01/29/15) 8 yr ago (01/25/15)  Glucose 107 High  77 91 CM      Uric Acid 7.7 High  6.0 CM  Comment:            Therapeutic target for gout patients: <6.0  BUN 12 9 13  R      Creatinine, Ser 0.77 0.74 1.08 High  R      eGFR 93 98       BUN/Creatinine Ratio 16 12       Sodium 142 138 139 R      Potassium 3.9 4.1 3.0 Low  R      Chloride 99 102 105 R      Calcium 10.0 9.5 9.6 R      Phosphorus 2.5 Low  2.9 Low        Total Protein 7.4 6.5 6.5 R      Albumin 4.9 4.5 3.9 R      Globulin, Total 2.5 2.0       Bilirubin Total 0.5 0.4 0.3 R      Alkaline Phosphatase 117 103 100 R      LDH 276 High  211       AST 24 15 26  R      ALT 38 High  26 38 R      GGT 22 17       Iron 115 86       Cholesterol, Total 247 High  189       Triglycerides 137 116   129 R    HDL 49 45   42 R    VLDL Cholesterol Cal 25 21       LDL Chol Calc (NIH) 173 High  123 High        Chol/HDL Ratio 5.0 High  4.2 CM       Comment:                                    T. Chol/HDL Ratio                                             Men  Women                               1/2 Avg.Risk  3.4    3.3                                   Avg.Risk  5.0    4.4                                2X Avg.Risk  9.6    7.1                                3X Avg.Risk 23.4   11.0  Estimated CHD Risk 1.3 High  1.0 CM       Comment: The CHD Risk is based on the T. Chol/HDL ratio. Other factors affect CHD Risk such as hypertension, smoking, diabetes, severe obesity, and family history of premature CHD.  TSH 1.520 1.080       T4, Total 11.0 9.6       T3 Uptake Ratio  25 28       Free Thyroxine Index 2.8 2.7       WBC 5.6 6.6  11.4 High  R  11.2 High  R 10.7 High  R  RBC 5.75 High  5.44 High   5.25 High  R  4.24 R 4.96 R  Hemoglobin 17.8 High  16.9 High   16.4 High  R  13.0 R 15.4 High  R  Hematocrit 52.0 High  49.0 High   46.0 R  38.3 R 44.1 R  MCV 90 90  87.6 R  90.3 R 88.9 R  MCH 31.0 31.1  31.2 R  30.7 R 31.0 R  MCHC 34.2 34.5  35.7 R  33.9 R 34.9 R  RDW 12.0 12.3  12.1 R  12.8 R 12.6 R  Platelets 331 320  311 R  264 R 278 R  Neutrophils 54 55  66 R     Lymphs 38 36       Monocytes 6 8       Eos 1 1       Basos 1 0       Neutrophils Absolute 3.0 3.6  7.5 R     Lymphocytes Absolute 2.1 2.4  3.0 R     Monocytes Absolute 0.3 0.5       EOS (ABSOLUTE) 0.1 0.1       Basophils Absolute 0.0 0.0  0.0 R     Immature Granulocytes 0 0  0 R     Immature Grans (Abs) 0.0 0.0                        ____________________________________________  EKG Sinus rhythm at 61 bpm   ____________________________________________    ____________________________________________   INITIAL IMPRESSION / ASSESSMENT AND PLAN  As part of my medical decision making, I reviewed the following data within the electronic MEDICAL RECORD NUMBER       No acute findings on physical exam except for elevated blood pressure.  No acute findings on EKG.  Labs reveal elevated  cholesterol.  Recommend 3-day BP check and lifestyle modifications for follow-up in 6 months.     ____________________________________________   FINAL CLINICAL IMPRESSION Annual physical exam   ED Discharge Orders          Ordered    POCT Urinalysis Dipstick (CPT 81002)        12/05/23 0846             Note:  This document was prepared using Dragon voice recognition software and may include unintentional dictation errors.

## 2023-12-05 NOTE — Progress Notes (Signed)
 Pt presents today to complete physical. Pt didn't voice any concerns at this time. [name]

## 2023-12-12 ENCOUNTER — Encounter: Payer: Self-pay | Admitting: Physician Assistant

## 2023-12-12 ENCOUNTER — Ambulatory Visit: Payer: Self-pay | Admitting: Physician Assistant

## 2023-12-12 VITALS — BP 150/98 | HR 66 | Resp 16

## 2023-12-12 DIAGNOSIS — I10 Essential (primary) hypertension: Secondary | ICD-10-CM

## 2023-12-12 MED ORDER — LISINOPRIL-HYDROCHLOROTHIAZIDE 10-12.5 MG PO TABS: 1.0000 | ORAL_TABLET | Freq: Every day | ORAL | 0 refills | Status: DC

## 2023-12-12 NOTE — Progress Notes (Signed)
   Subjective: Elevated blood pressure    Patient ID: [name], female    DOB: 1970-11-29, 53 y.o.   MRN: [id number]  HPI Patient follow-up from a physical exam which occurred on 12/05/2023.  Patient has a history of hypertension however blood pressure was elevated at 172/95.  Patient takes hydrochlorothiazide  25 mg daily.  Denies headache, vision changes, vertigo.  Patient presents  with 3-days of elevated blood pressure readings from home unit.  Patient blood pressure today is 173/97.   Review of Systems Asthma and hypertension    Objective:   Physical Exam BP 173/97 150/98  BP Location Left Arm Left Arm  Patient Position Sitting Sitting  Cuff Size Large Large  Pulse Rate 66 --  Resp 16 --  SpO2 99 % --  Physical exam deferred.      Assessment & Plan: Hypertension  Patient is agreeable to a trial of Zestoretic  10/12.5 mg daily.  Patient will follow-up in 1 week.

## 2023-12-19 ENCOUNTER — Ambulatory Visit: Payer: Self-pay | Admitting: Physician Assistant

## 2023-12-19 ENCOUNTER — Encounter: Payer: Self-pay | Admitting: Physician Assistant

## 2023-12-19 VITALS — BP 146/99 | HR 66 | Resp 16

## 2023-12-19 DIAGNOSIS — I10 Essential (primary) hypertension: Secondary | ICD-10-CM

## 2023-12-19 NOTE — Progress Notes (Signed)
 Pt presents today for follow up on BP medication x 1 week.  Pt states she's not the type to get headaches even when BP is high. With starting this medication pt states she's been having these symptoms; headaches/cluster, itching all over and always feeling like something is crawling on her, loose stools x 2days, loss of voice and dry mouth/dry eyes.

## 2023-12-19 NOTE — Progress Notes (Signed)
   Subjective: Hypertension    Patient ID: [name], female    DOB: 07-03-1970, 53 y.o.   MRN: [id number]  HPI Patient is follow-up status post 6 days of starting Zestoretic  02/1224 mg.  Status post third medication patient has experienced headaches and her blood pressure fluctuates from normotensive to hypertensive readings..  She also complaining of itching, loose stools, loss of voice, and dry mouth.  Patient also complain of cough which she described as secondary to postnasal drainage.  Denies vision disturbance, weakness, or vertigo.  Patient states no recent history of headaches before taking the new medication.  Patient states she had a history of mini stroke at age 38 and 54.  Review of Systems Hypertension    Objective:   Physical Exam BP 146/99  Cuff Size Large  Pulse Rate 66  Resp 16  SpO2 98 %  No acute distress. HEENT is unremarkable.   Neck is supple for lymphadenopathy or bruits. Lungs clear to auscultation. Heart regular rate and rhythm.     Assessment & Plan: Hypertension  Advised patient to discontinue lisinopril /HCTZ until next visit 12/24/2027.  Patient advised to continue recording blood pressure twice a day.  And to see if there is resolution of her symptoms.  Will discuss alternative blood pressure medication

## 2023-12-24 ENCOUNTER — Encounter: Payer: Self-pay | Admitting: Physician Assistant

## 2023-12-24 ENCOUNTER — Ambulatory Visit: Payer: Self-pay | Admitting: Physician Assistant

## 2023-12-24 VITALS — BP 142/88 | HR 67

## 2023-12-24 DIAGNOSIS — I10 Essential (primary) hypertension: Secondary | ICD-10-CM

## 2023-12-24 NOTE — Progress Notes (Signed)
 Pt presents today for follow up on BP medication side effects. Pt states all symptoms have subsided since stopping.

## 2023-12-24 NOTE — Progress Notes (Signed)
   Subjective: Patient    Patient ID: [name], female    DOB: 1970-09-10, 53 y.o.   MRN: [id number]  HPI This patient is follow-up for hypertension.  Patient was prescribed Zestoretic  in 10/12 mg 2 weeks ago.  Patient states she started developing fluctuating blood pressures from normotensive to hypertensive.  Patient also complaining of itching loose stools loss of voice and dry mouth.  Patient also complaining of a nonproductive cough.  States medication also cause headaches.  Patient medication was discontinued on 12/19/2023.  Patient follow-up today stating all her complaints have resolved.  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  Patient has increased workload.  Also states they are building a new house.  Readings shows that the patient blood pressure is elevated in the afternoons.  Review of Systems Hypertension    Objective:   Physical Exam BP 157/86 142/88 Cuff SizeLargeLargePulse [id number]       Assessment & Plan: Hypertension  Per discussion we will restart HCTZ at 25 mg.  Patient advised to record blood pressure at home and follow-up in 1 week.

## 2023-12-31 ENCOUNTER — Ambulatory Visit: Payer: Self-pay | Admitting: Physician Assistant

## 2023-12-31 ENCOUNTER — Encounter: Payer: Self-pay | Admitting: Physician Assistant

## 2023-12-31 VITALS — BP 130/84 | HR 68 | Temp 97.7°F | Resp 16

## 2023-12-31 DIAGNOSIS — I10 Essential (primary) hypertension: Secondary | ICD-10-CM

## 2023-12-31 NOTE — Progress Notes (Signed)
   Subjective: Hypertension    Patient ID: [name], female    DOB: 03-13-1971, 53 y.o.   MRN: [id number]  HPI Patient follow-up 1 week status post discontinue Zestoretic  and starting hydrochlorothiazide  at 25 mg daily.  Patient states tolerating medication well has noticed a decrease of blood pressure.  Patient also states lifestyle changes consisting of increased exercise.  States she is able to handle stress of the job that was also factor in her blood pressure elevations.   Review of Systems Cerebrovascular accident, hypertension, and prolapsed lumbar disc.    Objective:   Physical Exam  BP 130/84  BP Location Left Arm  Patient Position Sitting  Cuff Size Normal  Pulse Rate 68  Temp 97.7 F (36.5 C)  Resp 16  SpO2 96 %  Review of patient log of of elevated blood pressure for the past week has shown normotensive readings. HEENT is normal. Neck is supple for lymphadenopathy or bruits. Lungs are clear to auscultation. Heart regular rate and rhythm.      Assessment & Plan: Hypertension   Advised to continue HCTZ at 25 mg daily.  Continue lifestyle changes.  Follow-up in 3 6 months.

## 2023-12-31 NOTE — Progress Notes (Signed)
 Been taking BP at home with regular readings as noted on her phone/machine.  Has been taking the  hydrochlorothiazide  25mg  daily and takes at same time daily and has not taken it yet today.  Reports walking multiple times per day and increased activity.  BP done in clinic manual.  Denies any swelling and reports tolerating the hydrochlorothiazide  well and reports increased energy.

## 2024-03-07 ENCOUNTER — Other Ambulatory Visit: Payer: Self-pay

## 2024-04-17 ENCOUNTER — Other Ambulatory Visit: Payer: Self-pay

## 2024-04-17 DIAGNOSIS — E876 Hypokalemia: Secondary | ICD-10-CM

## 2024-04-17 DIAGNOSIS — I10 Essential (primary) hypertension: Secondary | ICD-10-CM

## 2024-04-17 MED ORDER — POTASSIUM CHLORIDE CRYS ER 20 MEQ PO TBCR: 20.0000 meq | EXTENDED_RELEASE_TABLET | Freq: Every day | ORAL | 3 refills | Status: AC

## 2024-04-17 MED ORDER — HYDROCHLOROTHIAZIDE 25 MG PO TABS: 25.0000 mg | ORAL_TABLET | Freq: Every day | ORAL | 3 refills | Status: AC

## 2024-05-19 ENCOUNTER — Ambulatory Visit: Payer: Self-pay | Admitting: Physician Assistant

## 2024-05-19 ENCOUNTER — Encounter: Payer: Self-pay | Admitting: Physician Assistant

## 2024-05-19 VITALS — BP 147/87 | HR 64 | Resp 14 | Ht 65.0 in | Wt 189.0 lb

## 2024-05-19 DIAGNOSIS — J189 Pneumonia, unspecified organism: Secondary | ICD-10-CM

## 2024-05-19 MED ORDER — PSEUDOEPH-BROMPHEN-DM 30-2-10 MG/5ML PO SYRP: 5.0000 mL | ORAL_SOLUTION | Freq: Four times a day (QID) | ORAL | 0 refills | Status: AC | PRN

## 2024-05-19 MED ORDER — AZITHROMYCIN 250 MG PO TABS: ORAL_TABLET | ORAL | 0 refills | Status: AC

## 2024-05-19 NOTE — Progress Notes (Signed)
" ° °  Subjective: Cough and congestion    Patient ID: [name], female    DOB: Aug 11, 1970, 53 y.o.   MRN: [id number]  HPI Patient states 1 week of nonproductive cough and chest congestion.  Patient states mild wheezing.  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  Started developing cough the first night that she works in [employer].  Patient states increasing cough spells with mild fatigue.  Patient states cough increases with inspiration.   Review of Systems Asthma and hypertension    Objective:   Physical Exam BP 147/87  Cuff Size Large  Pulse Rate 64  Weight 189 lb (85.7 kg)  Height 5' 5 (1.651 m)  Resp 14  SpO2 98 %   BMI: 31.45 kg/m2  BSA: 1.98 m2  HEENT is unremarkable. Neck is supple full lymphadenopathy or bruits. Lungs with bilateral upper lobe wheezing and right lower lobe lung child breath sounds. Heart regular rate and rhythm.       Assessment & Plan: CAP  Patient given a prescription for Z-Pak and Bromfed-DM.  Advised to follow-up if no improvement or worsening complaint.  "

## 2024-05-19 NOTE — Progress Notes (Signed)
 Chest congestion, cough, wheezing, hard to take deep breathes x1 wk
